# Patient Record
Sex: Female | Born: 1990 | Race: Black or African American | Hispanic: No | Marital: Single | Smoking: Never smoker
Health system: Southern US, Community
[De-identification: ages and names within clinical notes are randomized; demographics above are authoritative.]

## PROBLEM LIST (undated history)

## (undated) DIAGNOSIS — E669 Obesity, unspecified: Secondary | ICD-10-CM

## (undated) DIAGNOSIS — I10 Essential (primary) hypertension: Secondary | ICD-10-CM

---
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
## 2011-03-27 ENCOUNTER — Emergency Department (HOSPITAL_BASED_OUTPATIENT_CLINIC_OR_DEPARTMENT_OTHER)
Admission: EM | Admit: 2011-03-27 | Discharge: 2011-03-27 | Disposition: A | Discharge status: Home / Self Care | Payer: Self-pay | Attending: Emergency Medicine | Admitting: Emergency Medicine

## 2011-03-27 DIAGNOSIS — R05 Cough: Secondary | ICD-10-CM | POA: Insufficient documentation

## 2011-03-27 DIAGNOSIS — R059 Cough, unspecified: Secondary | ICD-10-CM | POA: Insufficient documentation

## 2011-06-28 ENCOUNTER — Emergency Department (INDEPENDENT_AMBULATORY_CARE_PROVIDER_SITE_OTHER): Payer: Self-pay

## 2011-06-28 ENCOUNTER — Other Ambulatory Visit: Payer: Self-pay

## 2011-06-28 ENCOUNTER — Encounter: Payer: Self-pay | Admitting: Family Medicine

## 2011-06-28 ENCOUNTER — Emergency Department (HOSPITAL_BASED_OUTPATIENT_CLINIC_OR_DEPARTMENT_OTHER)
Admission: EM | Admit: 2011-06-28 | Discharge: 2011-06-28 | Disposition: A | Discharge status: Home / Self Care | Payer: Self-pay | Attending: Emergency Medicine | Admitting: Emergency Medicine

## 2011-06-28 DIAGNOSIS — R1013 Epigastric pain: Secondary | ICD-10-CM

## 2011-06-28 DIAGNOSIS — R079 Chest pain, unspecified: Secondary | ICD-10-CM | POA: Insufficient documentation

## 2011-06-28 DIAGNOSIS — R110 Nausea: Secondary | ICD-10-CM | POA: Insufficient documentation

## 2011-06-28 DIAGNOSIS — R0602 Shortness of breath: Secondary | ICD-10-CM

## 2011-06-28 DIAGNOSIS — R1012 Left upper quadrant pain: Secondary | ICD-10-CM | POA: Insufficient documentation

## 2011-06-28 LAB — DIFFERENTIAL
Eosinophils Absolute: 0.1 10*3/uL (ref 0.0–0.7)
Lymphocytes Relative: 39 % (ref 12–46)
Lymphs Abs: 2.3 10*3/uL (ref 0.7–4.0)
Monocytes Relative: 11 % (ref 3–12)
Neutrophils Relative %: 48 % (ref 43–77)

## 2011-06-28 LAB — BASIC METABOLIC PANEL
BUN: 6 mg/dL (ref 6–23)
CO2: 27 mEq/L (ref 19–32)
Chloride: 105 mEq/L (ref 96–112)
GFR calc non Af Amer: >90 mL/min (ref >90)
Glucose, Bld: 81 mg/dL (ref 70–99)
Potassium: 4.4 mEq/L (ref 3.5–5.1)
Sodium: 138 mEq/L (ref 135–145)

## 2011-06-28 LAB — PREGNANCY, URINE: Preg Test, Ur: NEGATIVE

## 2011-06-28 LAB — CBC
Hemoglobin: 11.7 g/dL — ABNORMAL LOW (ref 12.0–15.0)
MCH: 30.4 pg (ref 26.0–34.0)
RBC: 3.85 MIL/uL — ABNORMAL LOW (ref 3.87–5.11)
WBC: 6.1 10*3/uL (ref 4.0–10.5)

## 2011-06-28 NOTE — ED Notes (Signed)
Patient assisted to bathroom; Urine sample obtained.

## 2011-06-28 NOTE — ED Notes (Signed)
Pt c/o pain under left chest that started after eating approx 20 mins ago. Pt reports similar pain in past that was relieved by prevacid. Pt denies cardiac history. Pt rates pain 8/10 and worse with movement and deep inspiration.

## 2011-06-28 NOTE — ED Provider Notes (Signed)
History     CSN: [id number] Arrival date & time: 06/28/2011  6:59 PM  Chief Complaint  Patient presents with  . Chest Pain    (Consider location/radiation/quality/duration/timing/severity/associated sxs/prior treatment) HPI Comments: The patient is a 20 year old female who presents for evaluation of chest pain. The pain is localized at the left lower chest or rather the left upper abdomen. She reports the pain is sharp and its onset was immediately after eating bacon tonight. She reports the pain is gone now. There is no radiation of pain and the patient denies any shortness of breath, nausea, or vomiting. The pain was sharp and lasted approximately 15-20 minutes and has completely resolved now. She has no prior medical problems.  Patient is a 20 y.o. female presenting with chest pain. The history is provided by the patient.  Chest Pain The chest pain began 1 - 2 hours ago. Duration of episode(s) is 15 minutes. Chest pain occurs constantly. The chest pain is resolved. The pain is associated with eating. At its most intense, the pain is at 7/10. The pain is currently at 0/10. The quality of the pain is described as sharp. The pain does not radiate. Exacerbated by: nothing. Primary symptoms include abdominal pain and nausea. Pertinent negatives for primary symptoms include no fever, no fatigue, no syncope, no shortness of breath, no cough, no wheezing, no palpitations, no vomiting, no dizziness and no altered mental status.  The abdominal pain began today. The abdominal pain has been resolved since its onset. The abdominal pain is located in the LUQ. The abdominal pain does not radiate. The severity of the abdominal pain is 7/10. The abdominal pain is relieved by nothing.  Pertinent negatives for associated symptoms include no claudication, no diaphoresis, no lower extremity edema, no near-syncope, no numbness, no orthopnea, no paroxysmal nocturnal dyspnea and no weakness. She tried nothing for the  symptoms. Risk factors include no known risk factors.  Pertinent negatives for past medical history include no arrhythmia, no CAD, no congenital heart disease, no COPD, no CHF, no diabetes, no hyperlipidemia, no hypertension and no MI.  Pertinent negatives for family medical history include: no early MI in family.     History reviewed. No pertinent past medical history.  History reviewed. No pertinent past surgical history.  No family history on file.  History  Substance Use Topics  . Smoking status: Never Smoker   . Smokeless tobacco: Not on file  . Alcohol Use: No    OB History    Grav Para Term Preterm Abortions TAB SAB Ect Mult Living                  Review of Systems  Constitutional: Negative for fever, diaphoresis and fatigue.  HENT: Negative.   Eyes: Negative.   Respiratory: Negative for cough, choking, shortness of breath and wheezing.   Cardiovascular: Positive for chest pain. Negative for palpitations, orthopnea, claudication, leg swelling, syncope and near-syncope.  Gastrointestinal: Positive for nausea and abdominal pain. Negative for vomiting.  Genitourinary: Negative.   Musculoskeletal: Negative.   Skin: Negative.   Neurological: Negative for dizziness, weakness and numbness.  Psychiatric/Behavioral: Negative.  Negative for altered mental status.    Allergies  Review of patient's allergies indicates no known allergies.  Home Medications  No current outpatient prescriptions on file.  BP 121/78  Pulse 85  Temp(Src) 98.1 F (36.7 C) (Oral)  Resp 16  Ht 5\' 3"  (1.6 m)  Wt 221 lb 7 oz (100.443 kg)  BMI 39.23 kg/m2  SpO2 100%  LMP 05/29/2011  Physical Exam  Nursing note and vitals reviewed. Constitutional: She is oriented to person, place, and time. She appears well-nourished. No distress.  HENT:  Head: Normocephalic and atraumatic.  Mouth/Throat: Oropharynx is clear and moist.  Eyes: EOM are normal. Pupils are equal, round, and reactive to light.   Neck: Normal range of motion. Neck supple. No JVD present. No tracheal deviation present.  Cardiovascular: Normal rate, regular rhythm, S1 normal, S2 normal, normal heart sounds and intact distal pulses.   No extrasystoles are present. PMI is not displaced.  Exam reveals no gallop and no friction rub.   No murmur heard. Pulmonary/Chest: Effort normal and breath sounds normal. No accessory muscle usage or stridor. Not tachypneic. No respiratory distress. She has no decreased breath sounds. She has no wheezes. She has no rhonchi. She has no rales. She exhibits no tenderness, no bony tenderness, no crepitus and no retraction.  Abdominal: Soft. Bowel sounds are normal. She exhibits no distension and no mass. There is no tenderness. There is no rigidity, no rebound and no guarding.  Musculoskeletal: Normal range of motion. She exhibits no edema and no tenderness.  Neurological: She is alert and oriented to person, place, and time. No cranial nerve deficit. She exhibits normal muscle tone.  Skin: Skin is warm and dry. No rash noted. She is not diaphoretic. No erythema. No pallor.  Psychiatric: She has a normal mood and affect. Her behavior is normal. Judgment and thought content normal.    ED Course  Procedures (including critical care time)  Date: 06/28/2011  Rate: 83  Rhythm: normal sinus rhythm  QRS Axis: normal  Intervals: normal  ST/T Wave abnormalities: normal  Conduction Disutrbances:none  Narrative Interpretation: Normal EKG  Old EKG Reviewed: none available   Labs Reviewed  CBC - Abnormal; Notable for the following:    RBC 3.85 (*)    Hemoglobin 11.7 (*)    HCT 34.8 (*)    All other components within normal limits  PREGNANCY, URINE  TROPONIN I  DIFFERENTIAL  BASIC METABOLIC PANEL  URINALYSIS, ROUTINE W REFLEX MICROSCOPIC   No results found.   No diagnosis found.    MDM  The patient's chest pain appears most likely the gastrointestinal chest pain. Her EKG is normal and  at this time her laboratory studies and chest x-ray are also normal. The patient has remained chest pain-free at this time.        Felisa Bonier, MD 06/29/11 2135

## 2011-06-28 NOTE — ED Notes (Signed)
MD at bedside.Pt reports Hx of chest and epigastric discomfort

## 2011-08-26 DIAGNOSIS — R079 Chest pain, unspecified: Secondary | ICD-10-CM | POA: Insufficient documentation

## 2011-08-26 DIAGNOSIS — R071 Chest pain on breathing: Secondary | ICD-10-CM | POA: Insufficient documentation

## 2011-08-27 ENCOUNTER — Encounter (HOSPITAL_BASED_OUTPATIENT_CLINIC_OR_DEPARTMENT_OTHER): Payer: Self-pay | Admitting: *Deleted

## 2011-08-27 ENCOUNTER — Emergency Department (INDEPENDENT_AMBULATORY_CARE_PROVIDER_SITE_OTHER): Payer: Self-pay

## 2011-08-27 ENCOUNTER — Emergency Department (HOSPITAL_BASED_OUTPATIENT_CLINIC_OR_DEPARTMENT_OTHER)
Admission: EM | Admit: 2011-08-27 | Discharge: 2011-08-27 | Disposition: A | Discharge status: Home / Self Care | Payer: Self-pay | Attending: Emergency Medicine | Admitting: Emergency Medicine

## 2011-08-27 DIAGNOSIS — R079 Chest pain, unspecified: Secondary | ICD-10-CM

## 2011-08-27 DIAGNOSIS — R0789 Other chest pain: Secondary | ICD-10-CM

## 2011-08-27 NOTE — ED Provider Notes (Addendum)
History     CSN: [id number] Arrival date & time: 08/27/2011 12:11 AM   None     Chief Complaint  Patient presents with  . Muscle Pain   history of present illness :complains of right lateral chest pain onset 2-3 days ago after exercising pain is worse with twisting her torso proved with remaining still nonradiating pleuritic in quality mild in severity treated with ibuprofen with partial relief and adequate pain relief  (Consider location/radiation/quality/duration/timing/severity/associated sxs/prior treatment) HPI  History reviewed. No pertinent past medical history. None History reviewed. No pertinent past surgical history. None History reviewed. No pertinent family history.  History  Substance Use Topics  . Smoking status: Never Smoker   . Smokeless tobacco: Not on file  . Alcohol Use: No   Nonsmoker no alcohol no drug use OB History    Grav Para Term Preterm Abortions TAB SAB Ect Mult Living                  Review of Systems  Constitutional: Negative.   HENT: Negative.   Respiratory: Negative.   Cardiovascular: Positive for chest pain.  Gastrointestinal: Negative.   Musculoskeletal: Negative.   Skin: Negative.   Neurological: Negative.   Hematological: Negative.   Psychiatric/Behavioral: Negative.   All other systems reviewed and are negative.    Allergies  Review of patient's allergies indicates no known allergies.  Home Medications  No current outpatient prescriptions on file.  BP 134/73  Pulse 78  Temp(Src) 98.6 F (37 C) (Oral)  Resp 18  Ht 5\' 5"  (1.651 m)  Wt 230 lb (104.327 kg)  BMI 38.27 kg/m2  SpO2 100%  LMP 07/30/2011  Physical Exam  Nursing note and vitals reviewed. Constitutional: She appears well-developed and well-nourished.  HENT:  Head: Normocephalic and atraumatic.  Eyes: Conjunctivae are normal. Pupils are equal, round, and reactive to light.  Neck: Neck supple. No tracheal deviation present. No thyromegaly present.    Cardiovascular: Normal rate and regular rhythm.   No murmur heard. Pulmonary/Chest: Effort normal and breath sounds normal.       Right lateral chest wall mildly tender no crepitance  Abdominal: Soft. Bowel sounds are normal. She exhibits no distension. There is no tenderness.  Musculoskeletal: Normal range of motion. She exhibits no edema and no tenderness.  Neurological: She is alert. Coordination normal.  Skin: Skin is warm and dry. No rash noted.  Psychiatric: She has a normal mood and affect.    ED Course  Procedures (including critical care time)  Labs Reviewed - No data to display Dg Ribs Unilateral W/chest Right  08/27/2011  *RADIOLOGY REPORT*  Clinical Data: Right lower chest aches and rib pain.  RIGHT RIBS AND CHEST - 3+ VIEW  Comparison: Chest radiograph performed 06/28/2011  Findings: No displaced rib fractures are seen.  The lungs are well-aerated and clear.  There is no evidence of focal opacification, pleural effusion or pneumothorax.  The cardiomediastinal silhouette is within normal limits.  No acute osseous abnormalities are seen.  IMPRESSION: No displaced rib fractures seen; no acute cardiopulmonary process identified.  Original Report Authenticated By: Tonia Ghent, M.D.     No diagnosis found.   Date: 08/27/2011  Rate: 95  Rhythm: normal sinus rhythm  QRS Axis: normal  Intervals: normal  ST/T Wave abnormalities: nonspecific ST/T changes  Conduction Disutrbances:none  Narrative Interpretation:   Old EKG Reviewed: unchanged  Unchanged from 06/28/2011 MDM  Plan continue ibuprofen as needed. Patient declines prescription for other pain medicine Diagnosis chest  wall pain        Doug Sou, MD 08/27/11 1610  Doug Sou, MD 08/27/11 [id number]  Doug Sou, MD 08/27/11 [phone]

## 2011-08-27 NOTE — ED Notes (Signed)
Pt reports right side chest pain since working out- states "feels like my bones"- sx x 3 days

## 2011-11-15 ENCOUNTER — Emergency Department (HOSPITAL_BASED_OUTPATIENT_CLINIC_OR_DEPARTMENT_OTHER)
Admission: EM | Admit: 2011-11-15 | Discharge: 2011-11-15 | Disposition: A | Discharge status: Home / Self Care | Payer: Self-pay | Attending: Emergency Medicine | Admitting: Emergency Medicine

## 2011-11-15 ENCOUNTER — Encounter (HOSPITAL_BASED_OUTPATIENT_CLINIC_OR_DEPARTMENT_OTHER): Payer: Self-pay

## 2011-11-15 DIAGNOSIS — L723 Sebaceous cyst: Secondary | ICD-10-CM | POA: Insufficient documentation

## 2011-11-15 DIAGNOSIS — L03319 Cellulitis of trunk, unspecified: Secondary | ICD-10-CM | POA: Insufficient documentation

## 2011-11-15 DIAGNOSIS — L02219 Cutaneous abscess of trunk, unspecified: Secondary | ICD-10-CM | POA: Insufficient documentation

## 2011-11-15 DIAGNOSIS — L089 Local infection of the skin and subcutaneous tissue, unspecified: Secondary | ICD-10-CM

## 2011-11-15 MED ORDER — LIDOCAINE HCL 2 % IJ SOLN
10.0000 mL | Freq: Once | INTRAMUSCULAR | Status: AC
  Administered 2011-11-15: 20 mg via INTRADERMAL
  Filled 2011-11-15: qty 1

## 2011-11-15 NOTE — ED Provider Notes (Addendum)
History     CSN: [id number]  Arrival date & time 11/15/11  1422   First MD Initiated Contact with Patient 11/15/11 1444      Chief Complaint  Patient presents with  . Cyst    (Consider location/radiation/quality/duration/timing/severity/associated sxs/prior treatment) Patient is a 21 y.o. female presenting with abscess. The history is provided by the patient.  Abscess  This is a new problem. Episode onset: 3 days ago. The onset was gradual. The problem occurs continuously. The problem has been gradually worsening. Affected Location: Left chest. The problem is mild. The abscess is characterized by redness and swelling. It is unknown what she was exposed to. The abscess first occurred at home. Pertinent negatives include no anorexia, no fever, no rhinorrhea and no sore throat. Her past medical history does not include skin abscesses in family. There were no sick contacts. She has received no recent medical care.    History reviewed. No pertinent past medical history.  History reviewed. No pertinent past surgical history.  History reviewed. No pertinent family history.  History  Substance Use Topics  . Smoking status: Never Smoker   . Smokeless tobacco: Not on file  . Alcohol Use: No    OB History    Grav Para Term Preterm Abortions TAB SAB Ect Mult Living                  Review of Systems  Constitutional: Negative for fever.  HENT: Negative for sore throat and rhinorrhea.   Gastrointestinal: Negative for anorexia.  All other systems reviewed and are negative.    Allergies  Review of patient's allergies indicates no known allergies.  Home Medications  No current outpatient prescriptions on file.  BP 156/81  Pulse 96  Temp(Src) 98.2 F (36.8 C) (Oral)  Resp 18  Ht 5\' 3"  (1.6 m)  Wt 224 lb (101.606 kg)  BMI 39.68 kg/m2  SpO2 100%  LMP 10/30/2011  Physical Exam  Nursing note and vitals reviewed. Constitutional: She is oriented to person, place, and time.  She appears well-developed and well-nourished. No distress.  HENT:  Head: Normocephalic and atraumatic.  Eyes: EOM are normal. Pupils are equal, round, and reactive to light.  Pulmonary/Chest: She exhibits tenderness.    Neurological: She is alert and oriented to person, place, and time.  Skin: Skin is warm and dry. No rash noted. No erythema.    ED Course  Procedures (including critical care time)  Labs Reviewed - No data to display No results found.  INCISION AND DRAINAGE Performed by: Gwyneth Sprout Consent: Verbal consent obtained. Risks and benefits: risks, benefits and alternatives were discussed Type: abscess  Body area: left chest wall  Anesthesia: local infiltration  Local anesthetic: lidocaine 1% without epinephrine  Anesthetic total: 1 ml  Complexity:simple Drainage: purulent  Drainage amount: scant   Patient tolerance: Patient tolerated the procedure well with no immediate complications.    1. Infected sebaceous cyst       MDM   Patient with small infected sebaceous cyst on her chest. No other complicating features. Area was opened and cyst was expelled.        Gwyneth Sprout, MD 11/15/11 1515  Gwyneth Sprout, MD 11/15/11 1517

## 2011-11-15 NOTE — ED Notes (Signed)
C/o "bump on my chest"-painful to touch- x 1 week

## 2011-11-15 NOTE — Discharge Instructions (Signed)
Epidermal Cyst An epidermal cyst is sometimes called a sebaceous cyst, epidermal inclusion cyst, or infundibular cyst. These cysts usually contain a substance that looks "pasty" or "cheesy" and may have a bad smell. This substance is a protein called keratin. Epidermal cysts are usually found on the face, neck, or trunk. They may also occur in the vaginal area or other parts of the genitalia of both men and women. Epidermal cysts are usually small, painless, slow-growing bumps or lumps that move freely under the skin. It is important not to try to pop them. This may cause an infection and lead to tenderness and swelling. CAUSES  Epidermal cysts may be caused by a deep penetrating injury to the skin or a plugged hair follicle, often associated with acne. SYMPTOMS  Epidermal cysts can become inflamed and cause:  Redness.   Tenderness.   Increased temperature of the skin over the bumps or lumps.   Grayish-white, bad smelling material that drains from the bump or lump.  DIAGNOSIS  Epidermal cysts are easily diagnosed by your caregiver during an exam. Rarely, a tissue sample (biopsy) may be taken to rule out other conditions that may resemble epidermal cysts. TREATMENT   Epidermal cysts often get better and disappear on their own. They are rarely ever cancerous.   If a cyst becomes infected, it may become inflamed and tender. This may require opening and draining the cyst. Treatment with antibiotics may be necessary. When the infection is gone, the cyst may be removed with minor surgery.   Small, inflamed cysts can often be treated with antibiotics or by injecting steroid medicines.   Sometimes, epidermal cysts become large and bothersome. If this happens, surgical removal in your caregiver's office may be necessary.  HOME CARE INSTRUCTIONS  Only take over-the-counter or prescription medicines as directed by your caregiver.    warm compress 2-3 times a day or hot shower  SEEK MEDICAL CARE  IF:   Your cyst becomes tender, red, or swollen.   Your condition is not improving or is getting worse.   You have any other questions or concerns.  MAKE SURE YOU:  Understand these instructions.   Will watch your condition.   Will get help right away if you are not doing well or get worse.  Document Released: 08/11/2004 Document Revised: 05/23/2011 Document Reviewed: 03/19/2011 [facility] Patient Information 2012 [place], [place].

## 2012-05-08 ENCOUNTER — Encounter (HOSPITAL_BASED_OUTPATIENT_CLINIC_OR_DEPARTMENT_OTHER): Payer: Self-pay | Admitting: *Deleted

## 2012-05-08 ENCOUNTER — Emergency Department (HOSPITAL_BASED_OUTPATIENT_CLINIC_OR_DEPARTMENT_OTHER)
Admission: EM | Admit: 2012-05-08 | Discharge: 2012-05-08 | Disposition: A | Discharge status: Home / Self Care | Payer: Self-pay | Attending: Emergency Medicine | Admitting: Emergency Medicine

## 2012-05-08 DIAGNOSIS — F458 Other somatoform disorders: Secondary | ICD-10-CM | POA: Insufficient documentation

## 2012-05-08 DIAGNOSIS — I10 Essential (primary) hypertension: Secondary | ICD-10-CM | POA: Insufficient documentation

## 2012-05-08 DIAGNOSIS — R0609 Other forms of dyspnea: Secondary | ICD-10-CM | POA: Insufficient documentation

## 2012-05-08 DIAGNOSIS — R0989 Other specified symptoms and signs involving the circulatory and respiratory systems: Secondary | ICD-10-CM | POA: Insufficient documentation

## 2012-05-08 DIAGNOSIS — G4763 Sleep related bruxism: Secondary | ICD-10-CM

## 2012-05-08 DIAGNOSIS — R0683 Snoring: Secondary | ICD-10-CM

## 2012-05-08 MED ORDER — HYDROCHLOROTHIAZIDE 25 MG PO TABS: 25.0000 mg | ORAL_TABLET | Freq: Every day | ORAL | Status: DC

## 2012-05-08 NOTE — ED Provider Notes (Signed)
History     CSN: [id number]  Arrival date & time 05/08/12  1906   First MD Initiated Contact with Patient 05/08/12 1923      Chief Complaint  Patient presents with  . Headache     HPI Patient complains of headache mostly noted in the morning she wakes up.  The going on for several weeks.  Patient denies any nausea, vomiting, photophobia, fever, chills, or neck stiffness.  No history of recent trauma.  Patient does grind her teeth at night and she also has severe snoring problems. History reviewed. No pertinent past medical history.  History reviewed. No pertinent past surgical history.  No family history on file.  History  Substance Use Topics  . Smoking status: Never Smoker   . Smokeless tobacco: Not on file  . Alcohol Use: No    OB History    Grav Para Term Preterm Abortions TAB SAB Ect Mult Living                  Review of Systems  All other systems reviewed and are negative.    Allergies  Review of patient's allergies indicates no known allergies.  Home Medications   Current Outpatient Rx  Name Route Sig Dispense Refill  . HYDROCHLOROTHIAZIDE 25 MG PO TABS Oral Take 1 tablet (25 mg total) by mouth daily. 30 tablet 0    BP 138/88  Pulse 102  Temp 98.1 F (36.7 C) (Oral)  Resp 20  SpO2 100%  Physical Exam  Nursing note and vitals reviewed. Constitutional: She is oriented to person, place, and time. She appears well-developed. No distress.  HENT:  Head: Normocephalic and atraumatic.  Eyes: Conjunctivae and EOM are normal. Pupils are equal, round, and reactive to light.  Fundoscopic exam:      The right eye shows no hemorrhage and no papilledema. The right eye shows red reflex.      The left eye shows no hemorrhage and no papilledema. The left eye shows red reflex. Neck: Normal range of motion.  Cardiovascular: Normal rate and intact distal pulses.   Pulmonary/Chest: No respiratory distress.  Abdominal: Normal appearance. She exhibits no  distension.  Musculoskeletal: Normal range of motion.  Neurological: She is alert and oriented to person, place, and time. No cranial nerve deficit.  Skin: Skin is warm and dry. No rash noted.  Psychiatric: She has a normal mood and affect. Her behavior is normal.    ED Course  Procedures (including critical care time)  Labs Reviewed - No data to display No results found.   1. Hypertension   2. Snoring disorder   3. Bruxism, sleep-related       MDM          Nelia Shi, MD 05/08/12 2029

## 2012-05-08 NOTE — ED Notes (Signed)
Headache x 1 week

## 2012-05-08 NOTE — ED Notes (Signed)
Pt reports having htn with and without headaches, this has been a reoccuring problem that has been going on for a while. Long discussion with patient regarding the need for primary care physician to monitor bp and life style changes. Pt is currently looking for pcp

## 2012-05-29 ENCOUNTER — Encounter (HOSPITAL_BASED_OUTPATIENT_CLINIC_OR_DEPARTMENT_OTHER): Payer: Self-pay | Admitting: *Deleted

## 2012-05-29 ENCOUNTER — Emergency Department (HOSPITAL_BASED_OUTPATIENT_CLINIC_OR_DEPARTMENT_OTHER)
Admission: EM | Admit: 2012-05-29 | Discharge: 2012-05-29 | Disposition: A | Discharge status: Home / Self Care | Payer: Self-pay | Attending: Emergency Medicine | Admitting: Emergency Medicine

## 2012-05-29 DIAGNOSIS — J039 Acute tonsillitis, unspecified: Secondary | ICD-10-CM | POA: Insufficient documentation

## 2012-05-29 MED ORDER — PENICILLIN V POTASSIUM 500 MG PO TABS: 500.0000 mg | ORAL_TABLET | Freq: Four times a day (QID) | ORAL | Status: AC

## 2012-05-29 NOTE — ED Provider Notes (Signed)
History     CSN: [id number]  Arrival date & time 05/29/12  1604   First MD Initiated Contact with Patient 05/29/12 1613      Chief Complaint  Patient presents with  . Sore Throat  . Headache  . Dental Pain    (Consider location/radiation/quality/duration/timing/severity/associated sxs/prior treatment) Patient is a 21 y.o. female presenting with pharyngitis. The history is provided by the patient. No language interpreter was used.  Sore Throat This is a new problem. The current episode started yesterday. The problem has been gradually worsening. Associated symptoms include a fever and a sore throat. Nothing aggravates the symptoms. She has tried nothing for the symptoms. The treatment provided no relief.   Pt reports she has a sorethroat and a fever.  Pt reports white spots on tonsils History reviewed. No pertinent past medical history.  History reviewed. No pertinent past surgical history.  History reviewed. No pertinent family history.  History  Substance Use Topics  . Smoking status: Never Smoker   . Smokeless tobacco: Not on file  . Alcohol Use: No    OB History    Grav Para Term Preterm Abortions TAB SAB Ect Mult Living                  Review of Systems  Constitutional: Positive for fever.  HENT: Positive for sore throat.   All other systems reviewed and are negative.    Allergies  Review of patient's allergies indicates no known allergies.  Home Medications   Current Outpatient Rx  Name Route Sig Dispense Refill  . HYDROCHLOROTHIAZIDE 25 MG PO TABS Oral Take 1 tablet (25 mg total) by mouth daily. 30 tablet 0    BP 146/83  Pulse 112  Temp 99.1 F (37.3 C) (Oral)  Resp 20  SpO2 100%  LMP 04/20/2012  Physical Exam  Nursing note and vitals reviewed. Constitutional: She appears well-developed and well-nourished.  HENT:  Head: Normocephalic and atraumatic.  Mouth/Throat: Oropharyngeal exudate present.       Swollen tonsils,  exudate  Eyes:  Pupils are equal, round, and reactive to light.  Neck: Normal range of motion. Neck supple.  Cardiovascular: Normal rate and normal heart sounds.   Pulmonary/Chest: Effort normal and breath sounds normal.  Abdominal: Soft. There is no tenderness.  Musculoskeletal: Normal range of motion.  Neurological: She is alert.  Skin: Skin is warm.    ED Course  Procedures (including critical care time)   Labs Reviewed  RAPID STREP SCREEN   No results found.   1. Tonsillitis       MDM  Pt reports she also has had a headache bodyaches and breast discomfort bilat,   No pregnant, no abd pain.    I advised tylenol,   I will treat with pcn.   I advised follow up with primary Md for recheck.        Lonia Skinner Bell, PA 05/29/12 1721  Lonia Skinner [place], [place] 06/02/12 1815

## 2012-05-29 NOTE — ED Notes (Signed)
Pt states she has an abcessed tooth a very sore throat with white patches on throat states very painful to swallow. Also has headache and nipple pain.

## 2012-06-04 NOTE — ED Provider Notes (Signed)
Medical screening examination/treatment/procedure(s) were performed by non-physician practitioner and as supervising physician I was immediately available for consultation/collaboration.  Cyndra Numbers, MD 06/04/12 1010

## 2012-06-12 ENCOUNTER — Emergency Department (HOSPITAL_BASED_OUTPATIENT_CLINIC_OR_DEPARTMENT_OTHER)
Admission: EM | Admit: 2012-06-12 | Discharge: 2012-06-12 | Disposition: A | Discharge status: Home / Self Care | Payer: Self-pay | Attending: Emergency Medicine | Admitting: Emergency Medicine

## 2012-06-12 ENCOUNTER — Encounter (HOSPITAL_BASED_OUTPATIENT_CLINIC_OR_DEPARTMENT_OTHER): Payer: Self-pay | Admitting: *Deleted

## 2012-06-12 DIAGNOSIS — I10 Essential (primary) hypertension: Secondary | ICD-10-CM

## 2012-06-12 DIAGNOSIS — Z79899 Other long term (current) drug therapy: Secondary | ICD-10-CM | POA: Insufficient documentation

## 2012-06-12 DIAGNOSIS — R51 Headache: Secondary | ICD-10-CM

## 2012-06-12 HISTORY — DX: Essential (primary) hypertension: I10

## 2012-06-12 MED ORDER — HYDROCHLOROTHIAZIDE 25 MG PO TABS
25.0000 mg | ORAL_TABLET | Freq: Once | ORAL | Status: AC
  Administered 2012-06-12: 25 mg via ORAL
  Filled 2012-06-12: qty 1

## 2012-06-12 MED ORDER — IBUPROFEN 400 MG PO TABS
400.0000 mg | ORAL_TABLET | Freq: Once | ORAL | Status: AC
  Administered 2012-06-12: 400 mg via ORAL
  Filled 2012-06-12: qty 1

## 2012-06-12 MED ORDER — HYDROCHLOROTHIAZIDE 25 MG PO TABS: 25.0000 mg | ORAL_TABLET | Freq: Every day | ORAL | Status: DC

## 2012-06-12 NOTE — ED Provider Notes (Signed)
History     CSN: [id number]  Arrival date & time 06/12/12  Jill Clark   First MD Initiated Contact with Patient 06/12/12 1953      Chief Complaint  Patient presents with  . Headache    (Consider location/radiation/quality/duration/timing/severity/associated sxs/prior treatment) HPI Comments: Pt reports she ran out of BP meds about 1 week ago and since then has had a mild waxing and waning HA.  She recently got over tonsillitis.  She denies fever, stiff neck, focal numbness or weakness or other stroke like symptoms.  No rash.  She thinks that if she simply was back on BP meds, her HA would be better.  Pt has had similar HA's in the past.  Denies abrupt HA.  No N/V/D.  No photophobia, loss of balance, syncope.    Patient is a 21 y.o. female presenting with headaches. The history is provided by the patient.  Headache  Pertinent negatives include no fever, no nausea and no vomiting.    Past Medical History  Diagnosis Date  . Hypertension     History reviewed. No pertinent past surgical history.  History reviewed. No pertinent family history.  History  Substance Use Topics  . Smoking status: Never Smoker   . Smokeless tobacco: Not on file  . Alcohol Use: No    OB History    Grav Para Term Preterm Abortions TAB SAB Ect Mult Living                  Review of Systems  Constitutional: Negative for fever and chills.  HENT: Negative for neck pain and neck stiffness.   Eyes: Negative for photophobia and visual disturbance.  Gastrointestinal: Negative for nausea and vomiting.  Skin: Negative for rash.  Neurological: Positive for headaches. Negative for dizziness, seizures, syncope, weakness, light-headedness and numbness.    Allergies  Review of patient's allergies indicates no known allergies.  Home Medications   Current Outpatient Rx  Name Route Sig Dispense Refill  . HYDROCHLOROTHIAZIDE 25 MG PO TABS Oral Take 1 tablet (25 mg total) by mouth daily. 30 tablet 0  .  HYDROCHLOROTHIAZIDE 25 MG PO TABS Oral Take 1 tablet (25 mg total) by mouth daily. 30 tablet 1    BP 145/85  Pulse 85  Temp 98 F (36.7 C) (Oral)  Resp 16  Ht 5\' 4"  (1.626 m)  Wt 240 lb (108.863 kg)  BMI 41.20 kg/m2  SpO2 100%  LMP 04/20/2012  Physical Exam  Nursing note and vitals reviewed. Constitutional: She is oriented to person, place, and time. She appears well-developed and well-nourished.  HENT:  Head: Normocephalic and atraumatic.  Eyes: Pupils are equal, round, and reactive to light. No scleral icterus.  Neck: Normal range of motion. Neck supple.  Pulmonary/Chest: Effort normal.  Abdominal: Soft. There is no rebound and no guarding.  Neurological: She is alert and oriented to person, place, and time. No cranial nerve deficit. She exhibits normal muscle tone. Coordination and gait normal.  Skin: Skin is warm and dry.  Psychiatric: She has a normal mood and affect.    ED Course  Procedures (including critical care time)  Labs Reviewed - No data to display No results found.   1. Headache   2. Hypertension       MDM  Non focal neuro exam, BP is mildly elevated, no evidence or signs of end organ failure, will d/c home        Gavin Pound. Oletta Lamas, MD 06/12/12 2010

## 2012-06-12 NOTE — Discharge Instructions (Signed)
 Arterial Hypertension Arterial hypertension (high blood pressure) is a condition of elevated pressure in your blood vessels. Hypertension over a long period of time is a risk factor for strokes, heart attacks, and heart failure. It is also the leading cause of kidney (renal) failure.  CAUSES   In Adults -- Over 90% of all hypertension has no known cause. This is called essential or primary hypertension. In the other 10% of people with hypertension, the increase in blood pressure is caused by another disorder. This is called secondary hypertension. Important causes of secondary hypertension are:   Heavy alcohol use.   Obstructive sleep apnea.   Hyperaldosterosim (Conn's syndrome).   Steroid use.   Chronic kidney failure.   Hyperparathyroidism.   Medications.   Renal artery stenosis.   Pheochromocytoma.   Cushing's disease.   Coarctation of the aorta.   Scleroderma renal crisis.   Licorice (in excessive amounts).   Drugs (cocaine, methamphetamine).  Your caregiver can explain any items above that apply to you.  In Children -- Secondary hypertension is more common and should always be considered.   Pregnancy -- Few women of childbearing age have high blood pressure. However, up to 10% of them develop hypertension of pregnancy. Generally, this will not harm the woman. It may be a sign of 3 complications of pregnancy: preeclampsia, HELLP syndrome, and eclampsia. Follow up and control with medication is necessary.  SYMPTOMS   This condition normally does not produce any noticeable symptoms. It is usually found during a routine exam.   Malignant hypertension is a late problem of high blood pressure. It may have the following symptoms:   Headaches.   Blurred vision.   End-organ damage (this means your kidneys, heart, lungs, and other organs are being damaged).   Stressful situations can increase the blood pressure. If a person with normal blood pressure has their blood  pressure go up while being seen by their caregiver, this is often termed "white coat hypertension." Its importance is not known. It may be related with eventually developing hypertension or complications of hypertension.   Hypertension is often confused with mental tension, stress, and anxiety.  DIAGNOSIS  The diagnosis is made by 3 separate blood pressure measurements. They are taken at least 1 week apart from each other. If there is organ damage from hypertension, the diagnosis may be made without repeat measurements. Hypertension is usually identified by having blood pressure readings:  Above 140/90 mmHg measured in both arms, at 3 separate times, over a couple weeks.   Over 130/80 mmHg should be considered a risk factor and may require treatment in patients with diabetes.  Blood pressure readings over 120/80 mmHg are called "pre-hypertension" even in non-diabetic patients. To get a true blood pressure measurement, use the following guidelines. Be aware of the factors that can alter blood pressure readings.  Take measurements at least 1 hour after caffeine.   Take measurements 30 minutes after smoking and without any stress. This is another reason to quit smoking - it raises your blood pressure.   Use a proper cuff size. Ask your caregiver if you are not sure about your cuff size.   Most home blood pressure cuffs are automatic. They will measure systolic and diastolic pressures. The systolic pressure is the pressure reading at the start of sounds. Diastolic pressure is the pressure at which the sounds disappear. If you are elderly, measure pressures in multiple postures. Try sitting, lying or standing.   Sit at rest for a minimum of  5 minutes before taking measurements.   You should not be on any medications like decongestants. These are found in many cold medications.   Record your blood pressure readings and review them with your caregiver.  If you have hypertension:  Your caregiver  may do tests to be sure you do not have secondary hypertension (see "causes" above).   Your caregiver may also look for signs of metabolic syndrome. This is also called Syndrome X or Insulin Resistance Syndrome. You may have this syndrome if you have type 2 diabetes, abdominal obesity, and abnormal blood lipids in addition to hypertension.   Your caregiver will take your medical and family history and perform a physical exam.   Diagnostic tests may include blood tests (for glucose, cholesterol, potassium, and kidney function), a urinalysis, or an EKG. Other tests may also be necessary depending on your condition.  PREVENTION  There are important lifestyle issues that you can adopt to reduce your chance of developing hypertension:  Maintain a normal weight.   Limit the amount of salt (sodium) in your diet.   Exercise often.   Limit alcohol intake.   Get enough potassium in your diet. Discuss specific advice with your caregiver.   Follow a DASH diet (dietary approaches to stop hypertension). This diet is rich in fruits, vegetables, and low-fat dairy products, and avoids certain fats.  PROGNOSIS  Essential hypertension cannot be cured. Lifestyle changes and medical treatment can lower blood pressure and reduce complications. The prognosis of secondary hypertension depends on the underlying cause. Many people whose hypertension is controlled with medicine or lifestyle changes can live a normal, healthy life.  RISKS AND COMPLICATIONS  While high blood pressure alone is not an illness, it often requires treatment due to its short- and long-term effects on many organs. Hypertension increases your risk for:  CVAs or strokes (cerebrovascular accident).   Heart failure due to chronically high blood pressure (hypertensive cardiomyopathy).   Heart attack (myocardial infarction).   Damage to the retina (hypertensive retinopathy).   Kidney failure (hypertensive nephropathy).  Your caregiver can  explain list items above that apply to you. Treatment of hypertension can significantly reduce the risk of complications. TREATMENT   For overweight patients, weight loss and regular exercise are recommended. Physical fitness lowers blood pressure.   Mild hypertension is usually treated with diet and exercise. A diet rich in fruits and vegetables, fat-free dairy products, and foods low in fat and salt (sodium) can help lower blood pressure. Decreasing salt intake decreases blood pressure in a 1/3 of people.   Stop smoking if you are a smoker.  The steps above are highly effective in reducing blood pressure. While these actions are easy to suggest, they are difficult to achieve. Most patients with moderate or severe hypertension end up requiring medications to bring their blood pressure down to a normal level. There are several classes of medications for treatment. Blood pressure pills (antihypertensives) will lower blood pressure by their different actions. Lowering the blood pressure by 10 mmHg may decrease the risk of complications by as much as 25%. The goal of treatment is effective blood pressure control. This will reduce your risk for complications. Your caregiver will help you determine the best treatment for you according to your lifestyle. What is excellent treatment for one person, may not be for you. HOME CARE INSTRUCTIONS   Do not smoke.   Follow the lifestyle changes outlined in the "Prevention" section.   If you are on medications, follow the directions  carefully. Blood pressure medications must be taken as prescribed. Skipping doses reduces their benefit. It also puts you at risk for problems.   Follow up with your caregiver, as directed.   If you are asked to monitor your blood pressure at home, follow the guidelines in the "Diagnosis" section above.  SEEK MEDICAL CARE IF:   You think you are having medication side effects.   You have recurrent headaches or lightheadedness.     You have swelling in your ankles.   You have trouble with your vision.  SEEK IMMEDIATE MEDICAL CARE IF:   You have sudden onset of chest pain or pressure, difficulty breathing, or other symptoms of a heart attack.   You have a severe headache.   You have symptoms of a stroke (such as sudden weakness, difficulty speaking, difficulty walking).  MAKE SURE YOU:   Understand these instructions.   Will watch your condition.   Will get help right away if you are not doing well or get worse.  Document Released: 09/10/2005 Document Revised: 08/30/2011 Document Reviewed: 04/10/2007 [facility] Patient Information 2012 [place], [place].

## 2012-06-12 NOTE — ED Notes (Signed)
Pt c/o h/a x 1 week,  Out of hctz 25mg 

## 2012-06-12 NOTE — ED Notes (Signed)
Reports she has not had her b/p medication in over a week and it is typical for her to get a headache when her b/p goes up.  This headache is no different.

## 2012-09-09 ENCOUNTER — Emergency Department (HOSPITAL_BASED_OUTPATIENT_CLINIC_OR_DEPARTMENT_OTHER)
Admission: EM | Admit: 2012-09-09 | Discharge: 2012-09-09 | Disposition: A | Discharge status: Home / Self Care | Payer: Self-pay | Attending: Emergency Medicine | Admitting: Emergency Medicine

## 2012-09-09 ENCOUNTER — Encounter (HOSPITAL_BASED_OUTPATIENT_CLINIC_OR_DEPARTMENT_OTHER): Payer: Self-pay

## 2012-09-09 DIAGNOSIS — Z79899 Other long term (current) drug therapy: Secondary | ICD-10-CM | POA: Insufficient documentation

## 2012-09-09 DIAGNOSIS — R5381 Other malaise: Secondary | ICD-10-CM | POA: Insufficient documentation

## 2012-09-09 DIAGNOSIS — Z3202 Encounter for pregnancy test, result negative: Secondary | ICD-10-CM | POA: Insufficient documentation

## 2012-09-09 DIAGNOSIS — I10 Essential (primary) hypertension: Secondary | ICD-10-CM | POA: Insufficient documentation

## 2012-09-09 DIAGNOSIS — R51 Headache: Secondary | ICD-10-CM | POA: Insufficient documentation

## 2012-09-09 DIAGNOSIS — R531 Weakness: Secondary | ICD-10-CM

## 2012-09-09 DIAGNOSIS — R5383 Other fatigue: Secondary | ICD-10-CM | POA: Insufficient documentation

## 2012-09-09 LAB — URINALYSIS, ROUTINE W REFLEX MICROSCOPIC
Glucose, UA: NEGATIVE mg/dL
Hgb urine dipstick: NEGATIVE
Leukocytes, UA: NEGATIVE
Specific Gravity, Urine: 1.017 (ref 1.005–1.030)
pH: 7 (ref 5.0–8.0)

## 2012-09-09 LAB — CBC WITH DIFFERENTIAL/PLATELET
Basophils Absolute: 0 10*3/uL (ref 0.0–0.1)
Basophils Relative: 0 % (ref 0–1)
HCT: 35.7 % — ABNORMAL LOW (ref 36.0–46.0)
Hemoglobin: 12.2 g/dL (ref 12.0–15.0)
Lymphocytes Relative: 41 % (ref 12–46)
MCHC: 34.2 g/dL (ref 30.0–36.0)
Monocytes Absolute: 0.7 10*3/uL (ref 0.1–1.0)
Monocytes Relative: 14 % — ABNORMAL HIGH (ref 3–12)
Neutro Abs: 2.2 10*3/uL (ref 1.7–7.7)
Neutrophils Relative %: 43 % (ref 43–77)
RBC: 4.03 MIL/uL (ref 3.87–5.11)
WBC: 5.1 10*3/uL (ref 4.0–10.5)

## 2012-09-09 LAB — BASIC METABOLIC PANEL
BUN: 9 mg/dL (ref 6–23)
CO2: 27 mEq/L (ref 19–32)
Chloride: 101 mEq/L (ref 96–112)
Creatinine, Ser: 0.8 mg/dL (ref 0.50–1.10)
GFR calc Af Amer: >90 mL/min (ref >90)
Potassium: 3.7 mEq/L (ref 3.5–5.1)

## 2012-09-09 LAB — PREGNANCY, URINE: Preg Test, Ur: NEGATIVE

## 2012-09-09 MED ORDER — HYDROCHLOROTHIAZIDE 25 MG PO TABS: 25.0000 mg | ORAL_TABLET | Freq: Every day | ORAL | Status: DC

## 2012-09-09 MED ORDER — IBUPROFEN 800 MG PO TABS
800.0000 mg | ORAL_TABLET | Freq: Once | ORAL | Status: AC
  Administered 2012-09-09: 800 mg via ORAL
  Filled 2012-09-09: qty 1

## 2012-09-09 MED ORDER — IBUPROFEN 800 MG PO TABS: 800.0000 mg | ORAL_TABLET | Freq: Three times a day (TID) | ORAL | Status: DC

## 2012-09-09 NOTE — ED Notes (Signed)
Pt reports she awakened with a headache this morning, went to work and states she felt like she was going to pass out.

## 2012-09-09 NOTE — ED Provider Notes (Signed)
History     CSN: [id number]  Arrival date & time 09/09/12  1422   First MD Initiated Contact with Patient 09/09/12 1502      Chief Complaint  Patient presents with  . Weakness    (Consider location/radiation/quality/duration/timing/severity/associated sxs/prior treatment) Patient is a 21 y.o. female presenting with weakness. The history is provided by the patient. No language interpreter was used.  Weakness The primary symptoms include headaches. Primary symptoms do not include dizziness, nausea or vomiting. The symptoms began 2 to 6 hours ago. The symptoms are improving. The neurological symptoms are diffuse. Context: trouble sleeping.  The headache is associated with weakness.  Additional symptoms include weakness. Medical issues do not include diabetes.   Pt reports she feels tired and weak today.  Pt reports she did not sleep well last night.  Pt thinks she snored really bad.  Pt reports her throat was sore.   Past Medical History  Diagnosis Date  . Hypertension     History reviewed. No pertinent past surgical history.  No family history on file.  History  Substance Use Topics  . Smoking status: Never Smoker   . Smokeless tobacco: Not on file  . Alcohol Use: No    OB History    Grav Para Term Preterm Abortions TAB SAB Ect Mult Living                  Review of Systems  Gastrointestinal: Negative for nausea and vomiting.  Neurological: Positive for weakness and headaches. Negative for dizziness.  All other systems reviewed and are negative.    Allergies  Review of patient's allergies indicates no known allergies.  Home Medications   Current Outpatient Rx  Name  Route  Sig  Dispense  Refill  . HYDROCHLOROTHIAZIDE 25 MG PO TABS   Oral   Take 1 tablet (25 mg total) by mouth daily.   30 tablet   0   . HYDROCHLOROTHIAZIDE 25 MG PO TABS   Oral   Take 1 tablet (25 mg total) by mouth daily.   30 tablet   1     BP 146/95  Pulse 103  Temp 98.9 F  (37.2 C)  Resp 16  Ht 5\' 4"  (1.626 m)  Wt 250 lb (113.399 kg)  BMI 42.91 kg/m2  SpO2 100%  LMP 07/31/2012  Physical Exam  Nursing note and vitals reviewed. Constitutional: She is oriented to person, place, and time. She appears well-developed and well-nourished.  HENT:  Head: Normocephalic and atraumatic.  Eyes: Conjunctivae normal are normal. Pupils are equal, round, and reactive to light.  Neck: Normal range of motion. Neck supple.  Cardiovascular: Normal rate and normal heart sounds.   Pulmonary/Chest: Effort normal and breath sounds normal.  Abdominal: Soft. Bowel sounds are normal.  Musculoskeletal: Normal range of motion.  Neurological: She is alert and oriented to person, place, and time. She has normal reflexes.  Skin: Skin is warm.  Psychiatric: She has a normal mood and affect.    ED Course  Procedures (including critical care time)   Labs Reviewed  URINALYSIS, ROUTINE W REFLEX MICROSCOPIC  PREGNANCY, URINE  CBC WITH DIFFERENTIAL  BASIC METABOLIC PANEL   No results found.   1. Weakness   2. Headache       MDM   Results for orders placed during the hospital encounter of 09/09/12  URINALYSIS, ROUTINE W REFLEX MICROSCOPIC      Component Value Range   Color, Urine YELLOW  YELLOW   APPearance  CLEAR  CLEAR   Specific Gravity, Urine 1.017  1.005 - 1.030   pH 7.0  5.0 - 8.0   Glucose, UA NEGATIVE  NEGATIVE mg/dL   Hgb urine dipstick NEGATIVE  NEGATIVE   Bilirubin Urine NEGATIVE  NEGATIVE   Ketones, ur NEGATIVE  NEGATIVE mg/dL   Protein, ur NEGATIVE  NEGATIVE mg/dL   Urobilinogen, UA 1.0  0.0 - 1.0 mg/dL   Nitrite NEGATIVE  NEGATIVE   Leukocytes, UA NEGATIVE  NEGATIVE  PREGNANCY, URINE      Component Value Range   Preg Test, Ur NEGATIVE  NEGATIVE  CBC WITH DIFFERENTIAL      Component Value Range   WBC 5.1  4.0 - 10.5 K/uL   RBC 4.03  3.87 - 5.11 MIL/uL   Hemoglobin 12.2  12.0 - 15.0 g/dL   HCT 16.1 (*) 09.6 - 04.5 %   MCV 88.6  78.0 - 100.0 fL    MCH 30.3  26.0 - 34.0 pg   MCHC 34.2  30.0 - 36.0 g/dL   RDW 40.9  81.1 - 91.4 %   Platelets 236  150 - 400 K/uL   Neutrophils Relative 43  43 - 77 %   Neutro Abs 2.2  1.7 - 7.7 K/uL   Lymphocytes Relative 41  12 - 46 %   Lymphs Abs 2.1  0.7 - 4.0 K/uL   Monocytes Relative 14 (*) 3 - 12 %   Monocytes Absolute 0.7  0.1 - 1.0 K/uL   Eosinophils Relative 2  0 - 5 %   Eosinophils Absolute 0.1  0.0 - 0.7 K/uL   Basophils Relative 0  0 - 1 %   Basophils Absolute 0.0  0.0 - 0.1 K/uL   No results found.    Pt ibuprofen for headache.   Pt given referral sheet for primary care     Lonia Skinner [place], [place] 09/09/12 1559  Lonia Skinner [place], [place] 09/09/12 1600  Lonia Skinner [place], [place] 09/09/12 [phone]

## 2012-09-09 NOTE — ED Notes (Signed)
C/o weakness while at work today-felt like she was going to pass out-reports woke with HA this am

## 2012-09-11 NOTE — ED Provider Notes (Signed)
Medical screening examination/treatment/procedure(s) were performed by non-physician practitioner and as supervising physician I was immediately available for consultation/collaboration.   Timera Windt, MD 09/11/12 1612 

## 2012-10-14 ENCOUNTER — Emergency Department (HOSPITAL_BASED_OUTPATIENT_CLINIC_OR_DEPARTMENT_OTHER)
Admission: EM | Admit: 2012-10-14 | Discharge: 2012-10-14 | Disposition: A | Discharge status: Home / Self Care | Payer: Self-pay | Attending: Emergency Medicine | Admitting: Emergency Medicine

## 2012-10-14 ENCOUNTER — Encounter (HOSPITAL_BASED_OUTPATIENT_CLINIC_OR_DEPARTMENT_OTHER): Payer: Self-pay | Admitting: Emergency Medicine

## 2012-10-14 DIAGNOSIS — I10 Essential (primary) hypertension: Secondary | ICD-10-CM | POA: Insufficient documentation

## 2012-10-14 DIAGNOSIS — Z791 Long term (current) use of non-steroidal anti-inflammatories (NSAID): Secondary | ICD-10-CM | POA: Insufficient documentation

## 2012-10-14 DIAGNOSIS — Z79899 Other long term (current) drug therapy: Secondary | ICD-10-CM | POA: Insufficient documentation

## 2012-10-14 NOTE — ED Notes (Signed)
Pt c/o headache. Pt has been out of blood pressure medication. Pt picked up medication from pharmacy on the way to ED but has not taken it. Pt took ibuprofen at home for headache.

## 2012-10-14 NOTE — ED Provider Notes (Signed)
History     CSN: [id number]  Arrival date & time 10/14/12  0408   First MD Initiated Contact with Patient 10/14/12 [phone]      Chief Complaint  Patient presents with  . Headache    (Consider location/radiation/quality/duration/timing/severity/associated sxs/prior treatment) HPI This is a 22 year old female with a history of hypertension. She has not been taking her hydrochlorothiazide as prescribed. She will awoke about an hour ago with a headache which she states is moderate to severe. There was no associated neurologic deficit, visual changes, nausea or vomiting. She took ibuprofen with significant relief. Her headache is now a 2/10. On the way to the ED she stop by the drug store and had her hydrochlorothiazide filled. She did not take any out of concern that it may interact inappropriately with the ibuprofen. She is not having any chest pain or shortness of breath. She came here primarily to find out how high her blood pressure was before taking her hydrochlorothiazide.  Past Medical History  Diagnosis Date  . Hypertension     History reviewed. No pertinent past surgical history.  No family history on file.  History  Substance Use Topics  . Smoking status: Never Smoker   . Smokeless tobacco: Not on file  . Alcohol Use: No    OB History    Grav Para Term Preterm Abortions TAB SAB Ect Mult Living                  Review of Systems  All other systems reviewed and are negative.    Allergies  Review of patient's allergies indicates no known allergies.  Home Medications   Current Outpatient Rx  Name  Route  Sig  Dispense  Refill  . HYDROCHLOROTHIAZIDE 25 MG PO TABS   Oral   Take 1 tablet (25 mg total) by mouth daily.   30 tablet   0   . IBUPROFEN 400 MG PO TABS   Oral   Take 400 mg by mouth as needed.         Marland Kitchen HYDROCHLOROTHIAZIDE 25 MG PO TABS   Oral   Take 1 tablet (25 mg total) by mouth daily.   30 tablet   2   . IBUPROFEN 800 MG PO TABS   Oral  Take 1 tablet (800 mg total) by mouth 3 (three) times daily.   21 tablet   0     BP 160/100  Pulse 97  Temp 98.3 F (36.8 C) (Oral)  Resp 18  SpO2 100%  Physical Exam General: Well-developed, well-nourished female in no acute distress; appearance consistent with age of record HENT: normocephalic, atraumatic Eyes: pupils equal round and reactive to light; extraocular muscles intact; no retinal hemorrhage or papilledema seen Neck: supple Heart: regular rate and rhythm; no murmurs, rubs or gallops Lungs: clear to auscultation bilaterally Abdomen: soft; nondistended Extremities: No deformity; full range of motion Neurologic: Awake, alert and oriented; motor function intact in all extremities and symmetric; no facial droop Skin: Warm and dry Psychiatric: Normal mood and affect    ED Course  Procedures (including critical care time)    MDM  Patient was advised of the importance of taking her blood pressure medication regularly. She was advised that aggressive treatment in the absence of evidence of end-organ failure is not indicated.        Hanley Seamen, MD 10/14/12 0430

## 2012-11-21 ENCOUNTER — Emergency Department (HOSPITAL_COMMUNITY): Payer: Self-pay

## 2012-11-21 ENCOUNTER — Emergency Department (HOSPITAL_COMMUNITY)
Admission: EM | Admit: 2012-11-21 | Discharge: 2012-11-21 | Disposition: A | Discharge status: Home / Self Care | Payer: Self-pay | Attending: Emergency Medicine | Admitting: Emergency Medicine

## 2012-11-21 DIAGNOSIS — I10 Essential (primary) hypertension: Secondary | ICD-10-CM | POA: Insufficient documentation

## 2012-11-21 DIAGNOSIS — R079 Chest pain, unspecified: Secondary | ICD-10-CM | POA: Insufficient documentation

## 2012-11-21 DIAGNOSIS — Z79899 Other long term (current) drug therapy: Secondary | ICD-10-CM | POA: Insufficient documentation

## 2012-11-21 LAB — TROPONIN I: Troponin I: <0.3 ng/mL (ref <0.30)

## 2012-11-21 LAB — CBC
HCT: 40.1 % (ref 36.0–46.0)
Hemoglobin: 13.2 g/dL (ref 12.0–15.0)
MCH: 29.1 pg (ref 26.0–34.0)
MCHC: 32.9 g/dL (ref 30.0–36.0)
MCV: 88.5 fL (ref 78.0–100.0)
RBC: 4.53 MIL/uL (ref 3.87–5.11)

## 2012-11-21 LAB — BASIC METABOLIC PANEL
BUN: 10 mg/dL (ref 6–23)
CO2: 29 mEq/L (ref 19–32)
Calcium: 9.1 mg/dL (ref 8.4–10.5)
GFR calc non Af Amer: >90 mL/min (ref >90)
Glucose, Bld: 86 mg/dL (ref 70–99)
Sodium: 136 mEq/L (ref 135–145)

## 2012-11-21 MED ORDER — IBUPROFEN 600 MG PO TABS: 600.0000 mg | ORAL_TABLET | Freq: Four times a day (QID) | ORAL | Status: DC | PRN

## 2012-11-21 NOTE — ED Provider Notes (Signed)
History     CSN: [id number]  Arrival date & time 11/21/12  1845   First MD Initiated Contact with Patient 11/21/12 1911      Chief Complaint  Patient presents with  . Chest Pain    (Consider location/radiation/quality/duration/timing/severity/associated sxs/prior treatment) HPI Pt presents with c/o chest pain under left breast which has been intermittent over the past 2 weeks.  Pt states the pain comes on and is sharp and severe, then goes away without treatment.  She cannot say what makes it better or worse, not associated with exertion.  No sob, no cough or fever or chills.  No leg swelling.  She has not tried anything for the symptoms.  She denies hormone use, recent surgery or trauma or travel.  No hx of DVT/PE.  There are no other associated systemic symptoms, there are no other alleviating or modifying factors.   Past Medical History  Diagnosis Date  . Hypertension     No past surgical history on file.  No family history on file.  History  Substance Use Topics  . Smoking status: Never Smoker   . Smokeless tobacco: Not on file  . Alcohol Use: No    OB History   Grav Para Term Preterm Abortions TAB SAB Ect Mult Living                  Review of Systems ROS reviewed and all otherwise negative except for mentioned in HPI  Allergies  Review of patient's allergies indicates no known allergies.  Home Medications   Current Outpatient Rx  Name  Route  Sig  Dispense  Refill  . hydrochlorothiazide (HYDRODIURIL) 25 MG tablet   Oral   Take 25 mg by mouth every morning.         Marland Kitchen ibuprofen (ADVIL,MOTRIN) 600 MG tablet   Oral   Take 1 tablet (600 mg total) by mouth every 6 (six) hours as needed for pain.   30 tablet   0     BP 113/65  Pulse 99  Temp(Src) 97.8 F (36.6 C) (Oral)  Resp 17  SpO2 99%  LMP 08/21/2012 Vitals reviewed Physical Exam Physical Examination: General appearance - alert, well appearing, and in no distress Mental status - alert,  oriented to person, place, and time Eyes - no scleral icterus, no conjunctival injection Mouth - mucous membranes moist, pharynx normal without lesions Chest - clear to auscultation, no wheezes, rales or rhonchi, symmetric air entry, reproducible tenderness at left lower sternochostral junction.  Heart - normal rate, regular rhythm, normal S1, S2, no murmurs, rubs, clicks or gallops Abdomen - soft, nontender, nondistended, no masses or organomegaly, nabs Extremities - peripheral pulses normal, no pedal edema, no clubbing or cyanosis Skin - normal coloration and turgor, no rashes  ED Course  Procedures (including critical care time)   Date: 11/21/2012  Rate: 115  Rhythm: sinus tachycardia  QRS Axis: normal  Intervals: normal  ST/T Wave abnormalities: normal  Conduction Disutrbances:none  Narrative Interpretation:   Old EKG Reviewed: none available   Labs Reviewed  BASIC METABOLIC PANEL - Abnormal; Notable for the following:    Potassium 3.3 (*)    All other components within normal limits  CBC  D-DIMER, QUANTITATIVE  TROPONIN I   Dg Chest 2 View  11/21/2012  *RADIOLOGY REPORT*  Clinical Data: Chest pain, shortness of breath.  CHEST - 2 VIEW  Comparison: 08/27/2011  Findings: Heart and mediastinal contours are within normal limits. No focal opacities or effusions.  No acute bony abnormality.  IMPRESSION: Normal study.   Original Report Authenticated By: Charlett Nose, M.D.      1. Chest pain       MDM  Pt presenting with c/o chest pain, pain is reproducible on exam, CXR and EKG are reassuring.  Pt with mild tachycardia, so d-dimer obtained- which was reassuring.  Pt started on ibuprofen. Suspect costochondritis  Discharged with strict return precautions.  Pt agreeable with plan.        Ethelda Chick, MD 11/22/12 [phone]

## 2012-11-21 NOTE — ED Notes (Signed)
Pt reports chest pain x2 weeks. Pain increased today, pain is intermittent. Upper abdominal/ lower central chest 10/10. Pain increases during inspiration. Hx of HTN, reports increased SOB with ambulation. Denies n/v/d.

## 2012-11-21 NOTE — ED Notes (Signed)
Assessed pt. for chest pain, denies at this time. Family at bedside.

## 2013-01-11 ENCOUNTER — Encounter (HOSPITAL_BASED_OUTPATIENT_CLINIC_OR_DEPARTMENT_OTHER): Payer: Self-pay | Admitting: *Deleted

## 2013-01-11 ENCOUNTER — Emergency Department (HOSPITAL_BASED_OUTPATIENT_CLINIC_OR_DEPARTMENT_OTHER)
Admission: EM | Admit: 2013-01-11 | Discharge: 2013-01-12 | Disposition: A | Discharge status: Home / Self Care | Payer: Self-pay | Attending: Emergency Medicine | Admitting: Emergency Medicine

## 2013-01-11 DIAGNOSIS — R51 Headache: Secondary | ICD-10-CM | POA: Insufficient documentation

## 2013-01-11 DIAGNOSIS — H53149 Visual discomfort, unspecified: Secondary | ICD-10-CM | POA: Insufficient documentation

## 2013-01-11 DIAGNOSIS — Z3202 Encounter for pregnancy test, result negative: Secondary | ICD-10-CM | POA: Insufficient documentation

## 2013-01-11 DIAGNOSIS — I10 Essential (primary) hypertension: Secondary | ICD-10-CM | POA: Insufficient documentation

## 2013-01-11 DIAGNOSIS — R110 Nausea: Secondary | ICD-10-CM | POA: Insufficient documentation

## 2013-01-11 DIAGNOSIS — Z760 Encounter for issue of repeat prescription: Secondary | ICD-10-CM | POA: Insufficient documentation

## 2013-01-11 MED ORDER — DIPHENHYDRAMINE HCL 50 MG/ML IJ SOLN
25.0000 mg | Freq: Once | INTRAMUSCULAR | Status: AC
  Administered 2013-01-11: 25 mg via INTRAVENOUS
  Filled 2013-01-11: qty 1

## 2013-01-11 MED ORDER — METOCLOPRAMIDE HCL 5 MG/ML IJ SOLN
10.0000 mg | Freq: Once | INTRAMUSCULAR | Status: AC
  Administered 2013-01-11: 10 mg via INTRAVENOUS
  Filled 2013-01-11: qty 2

## 2013-01-11 MED ORDER — KETOROLAC TROMETHAMINE 30 MG/ML IJ SOLN
30.0000 mg | Freq: Once | INTRAMUSCULAR | Status: AC
  Administered 2013-01-11: 30 mg via INTRAVENOUS
  Filled 2013-01-11: qty 1

## 2013-01-11 MED ORDER — SODIUM CHLORIDE 0.9 % IV BOLUS (SEPSIS)
1000.0000 mL | Freq: Once | INTRAVENOUS | Status: AC
  Administered 2013-01-11: 1000 mL via INTRAVENOUS

## 2013-01-11 MED ORDER — HYDROCHLOROTHIAZIDE 12.5 MG PO TABS: 25.0000 mg | ORAL_TABLET | Freq: Every day | ORAL | Status: DC

## 2013-01-11 NOTE — ED Provider Notes (Signed)
History     CSN: [id number]  Arrival date & time 01/11/13  2024   First MD Initiated Contact with Patient 01/11/13 2050      Chief Complaint  Patient presents with  . Headache    (Consider location/radiation/quality/duration/timing/severity/associated sxs/prior treatment) HPI Comments: Patient is a 22 year old female with a past medical history of hypertension who presents with a headache for several weeks. Patient reports a gradual onset and progressive worsening of the headache. The pain is sharp, constant and is located in generalized head without radiation. Patient has tried ibuprofen for symptoms which has been helping but today did not. No alleviating/aggravating factors. Patient reports associated nausea and photophobia. Patient denies fever, vomiting, diarrhea, numbness/tingling, weakness, visual changes, congestion, chest pain, SOB, abdominal pain. Patient reports headaches are typical when she runs out of her HCTZ. She has been out of her medication for 3 weeks and would like a refill.     Patient is a 22 y.o. female presenting with headaches.  Headache   Past Medical History  Diagnosis Date  . Hypertension     History reviewed. No pertinent past surgical history.  No family history on file.  History  Substance Use Topics  . Smoking status: Never Smoker   . Smokeless tobacco: Not on file  . Alcohol Use: No    OB History   Grav Para Term Preterm Abortions TAB SAB Ect Mult Living                  Review of Systems  Neurological: Positive for headaches.  All other systems reviewed and are negative.    Allergies  Review of patient's allergies indicates no known allergies.  Home Medications   Current Outpatient Rx  Name  Route  Sig  Dispense  Refill  . hydrochlorothiazide (HYDRODIURIL) 25 MG tablet   Oral   Take 25 mg by mouth every morning. Has not been taking for past 3 weeks         . ibuprofen (ADVIL,MOTRIN) 600 MG tablet   Oral   Take 1  tablet (600 mg total) by mouth every 6 (six) hours as needed for pain.   30 tablet   0     BP 121/78  Pulse 91  Temp(Src) 98.5 F (36.9 C) (Oral)  Resp 16  Ht 5\' 3"  (1.6 m)  Wt 245 lb (111.131 kg)  BMI 43.41 kg/m2  SpO2 100%  LMP 08/13/2012  Physical Exam  Nursing note and vitals reviewed. Constitutional: She appears well-developed and well-nourished. No distress.  HENT:  Head: Normocephalic and atraumatic.  Eyes: Conjunctivae are normal.  Neck: Normal range of motion.  Cardiovascular: Normal rate and regular rhythm.  Exam reveals no gallop and no friction rub.   No murmur heard. Pulmonary/Chest: Effort normal and breath sounds normal. She has no wheezes. She has no rales. She exhibits no tenderness.  Abdominal: Soft. There is no tenderness.  Musculoskeletal: Normal range of motion.  Neurological: She is alert.  Speech is goal-oriented. Moves limbs without ataxia.   Skin: Skin is warm and dry.  Psychiatric: She has a normal mood and affect. Her behavior is normal.    ED Course  Procedures (including critical care time)  Labs Reviewed  PREGNANCY, URINE   No results found.   1. Headache   2. Medication refill       MDM  10:44 PM Patient reports improvement of her headache with toradol, reglan, and benadryl. Patient will have a refill of HCTZ.  Vitals stable and patient is afebrile. No further evaluation needed at this time. Patient instructed to return to the ED with worsening or concerning symptoms.         Emilia Beck, PA-C 01/11/13 2249

## 2013-01-11 NOTE — ED Notes (Signed)
Pt states she has not been taking her b/p meds for the past 3 weeks due to cost. C/o general h/a that started the past several weeks but today. States ibuprofen has not been relieving the pain. Similar pain when she is out of her meds. Denies any visual problems. Denies any n/v. C/o light sensitivity.

## 2013-01-12 NOTE — ED Provider Notes (Signed)
Medical screening examination/treatment/procedure(s) were performed by non-physician practitioner and as supervising physician I was immediately available for consultation/collaboration.   Carleene Cooper III, MD 01/12/13 [phone]

## 2013-02-05 ENCOUNTER — Encounter (HOSPITAL_COMMUNITY): Payer: Self-pay | Admitting: Emergency Medicine

## 2013-02-05 ENCOUNTER — Emergency Department (HOSPITAL_COMMUNITY)
Admission: EM | Admit: 2013-02-05 | Discharge: 2013-02-05 | Disposition: A | Discharge status: Home / Self Care | Payer: Self-pay | Attending: Emergency Medicine | Admitting: Emergency Medicine

## 2013-02-05 DIAGNOSIS — Z79899 Other long term (current) drug therapy: Secondary | ICD-10-CM | POA: Insufficient documentation

## 2013-02-05 DIAGNOSIS — R51 Headache: Secondary | ICD-10-CM | POA: Insufficient documentation

## 2013-02-05 DIAGNOSIS — H9209 Otalgia, unspecified ear: Secondary | ICD-10-CM | POA: Insufficient documentation

## 2013-02-05 DIAGNOSIS — R509 Fever, unspecified: Secondary | ICD-10-CM | POA: Insufficient documentation

## 2013-02-05 DIAGNOSIS — J0300 Acute streptococcal tonsillitis, unspecified: Secondary | ICD-10-CM

## 2013-02-05 DIAGNOSIS — IMO0001 Reserved for inherently not codable concepts without codable children: Secondary | ICD-10-CM | POA: Insufficient documentation

## 2013-02-05 DIAGNOSIS — I10 Essential (primary) hypertension: Secondary | ICD-10-CM | POA: Insufficient documentation

## 2013-02-05 DIAGNOSIS — J020 Streptococcal pharyngitis: Secondary | ICD-10-CM | POA: Insufficient documentation

## 2013-02-05 MED ORDER — IBUPROFEN 400 MG PO TABS
400.0000 mg | ORAL_TABLET | Freq: Once | ORAL | Status: AC
  Administered 2013-02-05: 400 mg via ORAL
  Filled 2013-02-05: qty 1

## 2013-02-05 MED ORDER — PENICILLIN V POTASSIUM 500 MG PO TABS: 500.0000 mg | ORAL_TABLET | Freq: Two times a day (BID) | ORAL | Status: AC

## 2013-02-05 MED ORDER — ACETAMINOPHEN 325 MG PO TABS
650.0000 mg | ORAL_TABLET | Freq: Once | ORAL | Status: AC
  Administered 2013-02-05: 650 mg via ORAL
  Filled 2013-02-05: qty 2

## 2013-02-05 MED ORDER — IBUPROFEN 600 MG PO TABS: 600.0000 mg | ORAL_TABLET | Freq: Four times a day (QID) | ORAL | Status: DC | PRN

## 2013-02-05 NOTE — ED Notes (Signed)
PT. REPORTS SORE THROAT WITH RIGHT EAR ACHE /HEADACHE FOR SEVERAL DAYS . FEBRILE AT TRIAGE .

## 2013-02-05 NOTE — ED Provider Notes (Signed)
History     CSN: [id number]  Arrival date & time 02/05/13  0157   First MD Initiated Contact with Patient 02/05/13 0301      Chief Complaint  Patient presents with  . Sore Throat  . Headache  . Otalgia    (Consider location/radiation/quality/duration/timing/severity/associated sxs/prior treatment) HPI Pt with several days of sore throat, fever, ear pain, generalized HA. Pt with recurrent strep infection. No voice changes, trouble breathing. No cough, SOB. No neck stiffness, focal weakness/numbness.  Past Medical History  Diagnosis Date  . Hypertension     History reviewed. No pertinent past surgical history.  No family history on file.  History  Substance Use Topics  . Smoking status: Never Smoker   . Smokeless tobacco: Not on file  . Alcohol Use: No    OB History   Grav Para Term Preterm Abortions TAB SAB Ect Mult Living                  Review of Systems  Constitutional: Positive for fever and fatigue.  HENT: Positive for ear pain and sore throat. Negative for congestion, rhinorrhea, trouble swallowing, neck pain, neck stiffness and voice change.   Respiratory: Negative for shortness of breath and wheezing.   Cardiovascular: Negative for chest pain.  Gastrointestinal: Negative for nausea, vomiting, abdominal pain and diarrhea.  Musculoskeletal: Positive for myalgias.  Skin: Negative for rash and wound.  Neurological: Positive for headaches. Negative for dizziness, weakness, light-headedness and numbness.  All other systems reviewed and are negative.    Allergies  Review of patient's allergies indicates no known allergies.  Home Medications   Current Outpatient Rx  Name  Route  Sig  Dispense  Refill  . hydrochlorothiazide (HYDRODIURIL) 25 MG tablet   Oral   Take 25 mg by mouth every morning. Has not been taking for past 3 weeks         . ibuprofen (ADVIL,MOTRIN) 600 MG tablet   Oral   Take 1 tablet (600 mg total) by mouth every 6 (six) hours as  needed for pain.   30 tablet   0   . penicillin v potassium (VEETID) 500 MG tablet   Oral   Take 1 tablet (500 mg total) by mouth 2 (two) times daily.   20 tablet   0     BP 129/87  Pulse 102  Temp(Src) 101.3 F (38.5 C) (Oral)  Resp 14  SpO2 97%  LMP 08/13/2012  Physical Exam  Nursing note and vitals reviewed. Constitutional: She is oriented to person, place, and time. She appears well-developed and well-nourished. No distress.  HENT:  Head: Normocephalic and atraumatic.  Mouth/Throat: Oropharyngeal exudate (tosilar enargement with eyrthema and exudates. No uvular deviation. ) present.  bl bulging TM's  Eyes: EOM are normal. Pupils are equal, round, and reactive to light.  Neck: Normal range of motion. Neck supple.  No meningismus    Cardiovascular: Normal rate and regular rhythm.   Pulmonary/Chest: Effort normal and breath sounds normal. No respiratory distress. She has no wheezes. She has no rales.  Abdominal: Soft. Bowel sounds are normal. She exhibits no distension and no mass. There is no tenderness. There is no rebound and no guarding.  Musculoskeletal: Normal range of motion. She exhibits no edema and no tenderness.  Lymphadenopathy:    She has cervical adenopathy.  Neurological: She is alert and oriented to person, place, and time.  5/5 motor, sensation intact  Skin: Skin is warm and dry. No rash noted. No  erythema.  Psychiatric: She has a normal mood and affect. Her behavior is normal.    ED Course  Procedures (including critical care time)  Labs Reviewed  RAPID STREP SCREEN - Abnormal; Notable for the following:    Streptococcus, Group A Screen (Direct) POSITIVE (*)    All other components within normal limits   No results found.   1. Strep tonsillitis       MDM  Offered IM abx. Pt refused. No airway compromise. Based on recurrence of strep infections, advised f/u with ENT for possible tonsillectomy. Return precautions given.          Loren Racer, MD 02/05/13 [phone]

## 2013-02-05 NOTE — ED Notes (Signed)
MD at bedside. 

## 2013-07-07 ENCOUNTER — Ambulatory Visit: Payer: Self-pay | Admitting: Family Medicine

## 2013-10-01 ENCOUNTER — Emergency Department (HOSPITAL_BASED_OUTPATIENT_CLINIC_OR_DEPARTMENT_OTHER)
Admission: EM | Admit: 2013-10-01 | Discharge: 2013-10-01 | Disposition: A | Discharge status: Home / Self Care | Payer: PRIVATE HEALTH INSURANCE | Attending: Emergency Medicine | Admitting: Emergency Medicine

## 2013-10-01 ENCOUNTER — Encounter (HOSPITAL_BASED_OUTPATIENT_CLINIC_OR_DEPARTMENT_OTHER): Payer: Self-pay | Admitting: Emergency Medicine

## 2013-10-01 DIAGNOSIS — H9209 Otalgia, unspecified ear: Secondary | ICD-10-CM | POA: Insufficient documentation

## 2013-10-01 DIAGNOSIS — I10 Essential (primary) hypertension: Secondary | ICD-10-CM | POA: Insufficient documentation

## 2013-10-01 DIAGNOSIS — R110 Nausea: Secondary | ICD-10-CM | POA: Insufficient documentation

## 2013-10-01 DIAGNOSIS — R69 Illness, unspecified: Secondary | ICD-10-CM

## 2013-10-01 DIAGNOSIS — J111 Influenza due to unidentified influenza virus with other respiratory manifestations: Secondary | ICD-10-CM | POA: Insufficient documentation

## 2013-10-01 DIAGNOSIS — IMO0001 Reserved for inherently not codable concepts without codable children: Secondary | ICD-10-CM | POA: Insufficient documentation

## 2013-10-01 LAB — RAPID STREP SCREEN (MED CTR MEBANE ONLY): Streptococcus, Group A Screen (Direct): NEGATIVE

## 2013-10-01 MED ORDER — OSELTAMIVIR PHOSPHATE 75 MG PO CAPS: 75.0000 mg | ORAL_CAPSULE | Freq: Two times a day (BID) | ORAL | Status: DC

## 2013-10-01 MED ORDER — PROMETHAZINE HCL 25 MG PO TABS: 25.0000 mg | ORAL_TABLET | Freq: Four times a day (QID) | ORAL | Status: DC | PRN

## 2013-10-01 MED ORDER — NAPROXEN 500 MG PO TABS: 500.0000 mg | ORAL_TABLET | Freq: Two times a day (BID) | ORAL | Status: DC

## 2013-10-01 NOTE — ED Notes (Signed)
C/o cold symptoms and sore throat x 3 days

## 2013-10-01 NOTE — Discharge Instructions (Signed)
Please call your doctor for a followup appointment within 24-48 hours. When you talk to your doctor please let them know that you were seen in the emergency department and have them acquire all of your records so that they can discuss the findings with you and formulate a treatment plan to fully care for your new and ongoing problems. ° °

## 2013-10-01 NOTE — ED Notes (Signed)
Pt c/o sore throat and fever x2 days.  

## 2013-10-01 NOTE — ED Provider Notes (Signed)
CSN: [id number]     Arrival date & time 10/01/13  2248 History  This chart was scribed for Vida RollerBrian D Caffie Sotto, MD by Ardelia Memsylan Malpass, ED Scribe. This patient was seen in room MH12/MH12 and the patient's care was started at 11:44 PM.   Chief Complaint  Patient presents with  . Sore Throat    The history is provided by the patient. No language interpreter was used.    HPI Comments: Jill Clark is a 23 y.o. Female with a history of HTN who presents to the Emergency Department complaining of a gradually worsening sore throat over the past 2-3 days. She reports associated nasal congestion and postnasal drip. She further reports associated myalgias, subjective fever and mild bilateral ear pain. She denies chills or any other symptoms.  Nothing seems to make this better or worse, no associated dysuria or cough   Past Medical History  Diagnosis Date  . Hypertension    History reviewed. No pertinent past surgical history. History reviewed. No pertinent family history. History  Substance Use Topics  . Smoking status: Never Smoker   . Smokeless tobacco: Not on file  . Alcohol Use: No   OB History   Grav Para Term Preterm Abortions TAB SAB Ect Mult Living                 Review of Systems  Constitutional: Positive for fever (subjective). Negative for chills.  HENT: Positive for congestion, ear pain (mild bilateral), postnasal drip and sore throat.   Gastrointestinal: Positive for nausea ( states that nasal drip makes her nauseated).  Musculoskeletal: Positive for myalgias.  All other systems reviewed and are negative.   Allergies  Review of patient's allergies indicates no known allergies.  Home Medications   Current Outpatient Rx  Name  Route  Sig  Dispense  Refill  . hydrochlorothiazide (HYDRODIURIL) 25 MG tablet   Oral   Take 25 mg by mouth every morning. Has not been taking for past 3 weeks         . ibuprofen (ADVIL,MOTRIN) 600 MG tablet   Oral   Take 1 tablet (600 mg total)  by mouth every 6 (six) hours as needed for pain.   30 tablet   0   . naproxen (NAPROSYN) 500 MG tablet   Oral   Take 1 tablet (500 mg total) by mouth 2 (two) times daily with a meal.   30 tablet   0   . oseltamivir (TAMIFLU) 75 MG capsule   Oral   Take 1 capsule (75 mg total) by mouth every 12 (twelve) hours.   10 capsule   0   . promethazine (PHENERGAN) 25 MG tablet   Oral   Take 1 tablet (25 mg total) by mouth every 6 (six) hours as needed for nausea or vomiting.   12 tablet   0    Triage Vitals: BP 133/82  Pulse 99  Temp(Src) 98.9 F (37.2 C)  Resp 16  Ht 5\' 4"  (1.626 m)  Wt 230 lb (104.327 kg)  BMI 39.46 kg/m2  SpO2 100%  LMP 10/01/2013  Physical Exam  Nursing note and vitals reviewed. Constitutional: She is oriented to person, place, and time. She appears well-developed and well-nourished. No distress.  HENT:  Head: Normocephalic and atraumatic.  Right Ear: External ear normal.  Left Ear: External ear normal.  Nose: Nose normal.  Mouth/Throat: Oropharynx is clear and moist. No oropharyngeal exudate.  Non-tender sinuses. Tympanic membranes normal bilaterally, oropharynx without exudate asymmetry  hypertrophy or erythema, moist mucous membranes, nasal passages are clear  Eyes: Conjunctivae and EOM are normal.  Neck: Normal range of motion. Neck supple.  No meningeal signs  Cardiovascular: Normal rate, regular rhythm and normal heart sounds.  Exam reveals no gallop and no friction rub.   No murmur heard. Pulmonary/Chest: Effort normal and breath sounds normal. No respiratory distress. She has no wheezes. She has no rales. She exhibits no tenderness.  Musculoskeletal: Normal range of motion. She exhibits no edema and no tenderness.  Lymphadenopathy:    She has no cervical adenopathy.  Neurological: She is alert and oriented to person, place, and time. No cranial nerve deficit.  Skin: Skin is warm and dry. She is not diaphoretic. No erythema.    ED Course   Procedures (including critical care time)  DIAGNOSTIC STUDIES: Oxygen Saturation is 100% on RA, normal by my interpretation.    COORDINATION OF CARE: 11:48 PM- Discussed negative Strep results, and clinical suspicion that pt has the flu. Pt advised of plan for treatment and pt agrees.  Labs Review Labs Reviewed  RAPID STREP SCREEN  CULTURE, GROUP A STREP   Imaging Review No results found.  EKG Interpretation   None       MDM   1. Influenza-like illness    The patient is well-appearing, strep negative, exam is benign, likely rhinitis, pharyngitis, possibly flu, we'll treat for flulike symptoms, patient is in agreement with the plan.  Meds given in ED:  Medications - No data to display  Discharge Medication List as of 10/01/2013 11:50 PM    START taking these medications   Details  naproxen (NAPROSYN) 500 MG tablet Take 1 tablet (500 mg total) by mouth 2 (two) times daily with a meal., Starting 10/01/2013, Until Discontinued, Print    oseltamivir (TAMIFLU) 75 MG capsule Take 1 capsule (75 mg total) by mouth every 12 (twelve) hours., Starting 10/01/2013, Until Discontinued, Print    promethazine (PHENERGAN) 25 MG tablet Take 1 tablet (25 mg total) by mouth every 6 (six) hours as needed for nausea or vomiting., Starting 10/01/2013, Until Discontinued, Print        I personally performed the services described in this documentation, which was scribed in my presence. The recorded information has been reviewed and is accurate.      Vida Roller, MD 10/02/13 [phone]

## 2013-10-04 LAB — CULTURE, GROUP A STREP

## 2014-11-06 ENCOUNTER — Emergency Department (INDEPENDENT_AMBULATORY_CARE_PROVIDER_SITE_OTHER)
Admission: EM | Admit: 2014-11-06 | Discharge: 2014-11-06 | Disposition: A | Discharge status: Home / Self Care | Payer: Commercial Managed Care - PPO | Source: Home / Self Care | Attending: Family Medicine | Admitting: Family Medicine

## 2014-11-06 ENCOUNTER — Emergency Department (INDEPENDENT_AMBULATORY_CARE_PROVIDER_SITE_OTHER): Payer: Commercial Managed Care - PPO

## 2014-11-06 DIAGNOSIS — M438X2 Other specified deforming dorsopathies, cervical region: Secondary | ICD-10-CM

## 2014-11-06 DIAGNOSIS — M62838 Other muscle spasm: Secondary | ICD-10-CM

## 2014-11-06 DIAGNOSIS — M6248 Contracture of muscle, other site: Secondary | ICD-10-CM | POA: Diagnosis not present

## 2014-11-06 MED ORDER — HYDROCODONE-ACETAMINOPHEN 5-325 MG PO TABS: ORAL_TABLET | ORAL | Status: DC

## 2014-11-06 MED ORDER — METAXALONE 800 MG PO TABS: 800.0000 mg | ORAL_TABLET | Freq: Three times a day (TID) | ORAL | Status: DC

## 2014-11-06 NOTE — ED Provider Notes (Signed)
CSN: [id number]     Arrival date & time 11/06/14  1308 History   First MD Initiated Contact with Patient 11/06/14 1354     Chief Complaint  Patient presents with  . Torticollis      HPI Comments: Patient awoke with left neck pain about 1.5 months ago that has persisted.  She recalls no injury.  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  The pain has not responded to ice application alternating with heat and Ibuprofen.  The pain does not radiate.  The history is provided by the patient.    Past Medical History  Diagnosis Date  . Hypertension    No past surgical history on file. No family history on file. History  Substance Use Topics  . Smoking status: Never Smoker   . Smokeless tobacco: Not on file  . Alcohol Use: No   OB History    No data available     Review of Systems  Constitutional: Negative for fever and chills.  HENT: Negative.   Eyes: Negative.   Respiratory: Negative.   Cardiovascular: Negative.   Gastrointestinal: Negative.   Genitourinary: Negative.   Musculoskeletal: Positive for neck pain and neck stiffness. Negative for back pain.  Skin: Negative.   Neurological: Negative for weakness, numbness and headaches.    Allergies  Review of patient's allergies indicates no known allergies.  Home Medications   Prior to Admission medications   Medication Sig Start Date End Date Taking? Authorizing Provider  hydrochlorothiazide (HYDRODIURIL) 25 MG tablet Take 25 mg by mouth every morning. Has not been taking for past 3 weeks 09/09/12  Yes Elson Areas, PA-C  HYDROcodone-acetaminophen (NORCO/VICODIN) 5-325 MG per tablet Take one by mouth at bedtime as needed for pain 11/06/14   Lattie Haw, MD  metaxalone (SKELAXIN) 800 MG tablet Take 1 tablet (800 mg total) by mouth 3 (three) times daily. 11/06/14   Lattie Haw, MD   BP 125/85 mmHg  Pulse 117  Temp(Src) 98.7 F (37.1 C) (Oral)  Ht  (1.626 m)  Wt 260 lb (117.935  kg)  BMI 44.61 kg/m2  SpO2 99% Physical Exam  Constitutional: She is oriented to person, place, and time. She appears well-developed and well-nourished. No distress.  HENT:  Head: Normocephalic.  Mouth/Throat: Oropharynx is clear and moist.  Eyes: Conjunctivae are normal. Pupils are equal, round, and reactive to light.  Neck: Full passive range of motion without pain. Neck supple. Muscular tenderness present. No spinous process tenderness present. No rigidity. Decreased range of motion present. No thyromegaly present.    Neck has tenderness over trapezius muscles bilaterally  Cardiovascular: Normal heart sounds.   Pulmonary/Chest: Breath sounds normal.  Lymphadenopathy:    She has no cervical adenopathy.  Neurological: She is alert and oriented to person, place, and time.  Skin: Skin is warm and dry. No rash noted.  Nursing note and vitals reviewed.   ED Course  Procedures  none  Imaging Review Dg Cervical Spine Complete  11/06/2014   CLINICAL DATA:  Chronic Mid left neck pain for past 1.5 months, nki  EXAM: CERVICAL SPINE  4+ VIEWS  COMPARISON:  None.  FINDINGS: There is no evidence of cervical spine fracture or prevertebral soft tissue swelling. Straightening of the normal cervical lordosis. No other significant bone abnormalities are identified.  IMPRESSION: 1. Negative for fracture or other acute bone abnormality. 2. Loss of the normal cervical spine lordosis, which may be secondary  to positioning, spasm, or soft tissue injury.   Electronically Signed   By: Corlis Leak  Hassell M.D.   On: 11/06/2014 15:00     MDM   1. Neck muscle spasm    Begin Skelaxin 800mg  TID, and Lortab at bedtime. Apply heating pad to neck 2 to 3 times daily.  Begin range of motion exercises. Followup with Dr. Rodney Langtonhomas [facility]and[facility] ([facility]) if not improving about two weeks.     Lattie HawStephen A Aleighna Wojtas, MD 11/07/14 2141

## 2014-11-06 NOTE — Discharge Instructions (Signed)
Apply heating pad to neck 2 to 3 times daily.  Begin range of motion exercises.   Heat Therapy Heat therapy can help ease sore, stiff, injured, and tight muscles and joints. Heat relaxes your muscles, which may help ease your pain.  RISKS AND COMPLICATIONS If you have any of the following conditions, do not use heat therapy unless your health care provider has approved:  Poor circulation.  Healing wounds or scarred skin in the area being treated.  Diabetes, heart disease, or high blood pressure.  Not being able to feel (numbness) the area being treated.  Unusual swelling of the area being treated.  Active infections.  Blood clots.  Cancer.  Inability to communicate pain. This may include young children and people who have problems with their brain function (dementia).  Pregnancy. Heat therapy should only be used on old, pre-existing, or long-lasting (chronic) injuries. Do not use heat therapy on new injuries unless directed by your health care provider. HOW TO USE HEAT THERAPY There are several different kinds of heat therapy, including:  Moist heat pack.  Warm water bath.  Hot water bottle.  Electric heating pad.  Heated gel pack.  Heated wrap.  Electric heating pad. Use the heat therapy method suggested by your health care provider. Follow your health care provider's instructions on when and how to use heat therapy. GENERAL HEAT THERAPY RECOMMENDATIONS  Do not sleep while using heat therapy. Only use heat therapy while you are awake.  Your skin may turn pink while using heat therapy. Do not use heat therapy if your skin turns red.  Do not use heat therapy if you have new pain.  High heat or long exposure to heat can cause burns. Be careful when using heat therapy to avoid burning your skin.  Do not use heat therapy on areas of your skin that are already irritated, such as with a rash or sunburn. SEEK MEDICAL CARE IF:  You have blisters, redness, swelling,  or numbness.  You have new pain.  Your pain is worse. MAKE SURE YOU:  Understand these instructions.  Will watch your condition.  Will get help right away if you are not doing well or get worse. Document Released: 12/03/2011 Document Revised: 01/25/2014 Document Reviewed: 11/03/2013 [facility] Patient Information 2015 [employer] [place]. This information is not intended to replace advice given to you by your health care provider. Make sure you discuss any questions you have with your health care provider.

## 2014-11-06 NOTE — ED Notes (Signed)
Patient complains of neck pain that started approximately one month ago. Denies any injury. 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 She has tried OTC Ibuprofen that has not helped much with pain. She also notes that yesterday as she was getting her hair done that she did experience some dizziness.

## 2016-01-04 IMAGING — DX DG CERVICAL SPINE COMPLETE 4+V
6 series · 6 of 6 positions shown · non-contrast
Comparison: None.

CLINICAL DATA: Chronic Mid left neck pain for past 1.5 months, nki

EXAM:
CERVICAL SPINE  4+ VIEWS

[c-spine lateral]
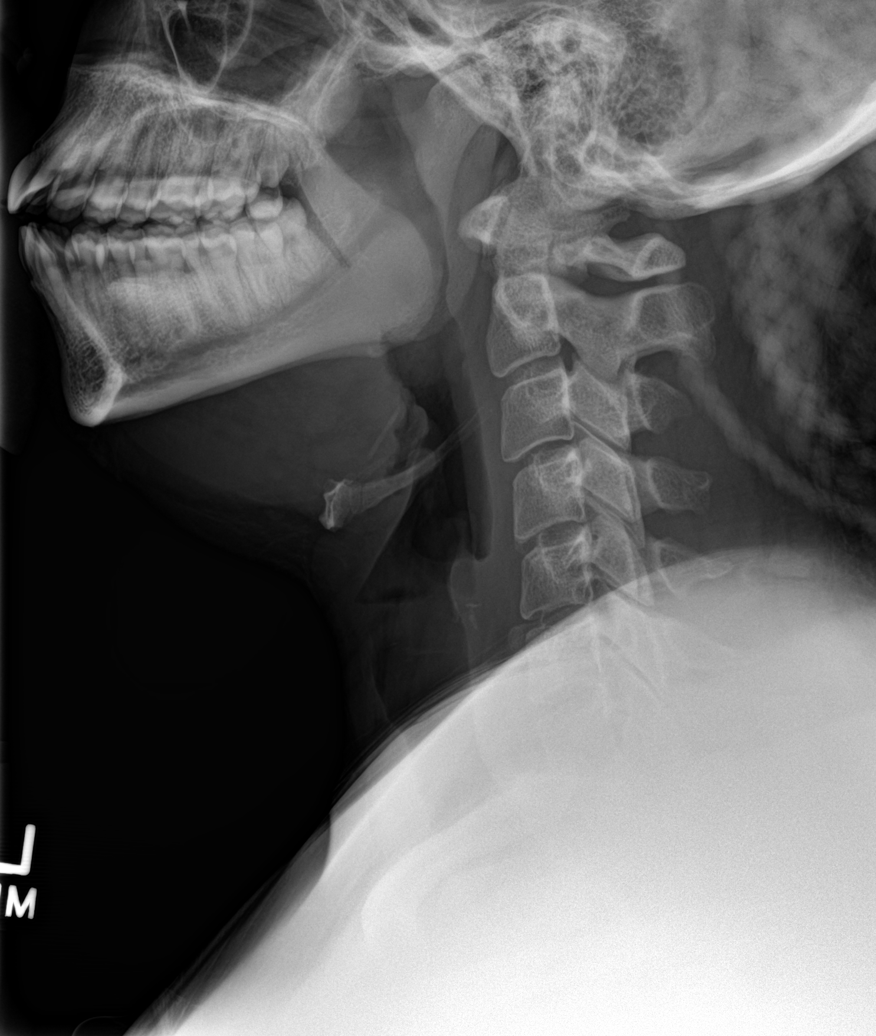

[c-spine obl (1 of 2)]
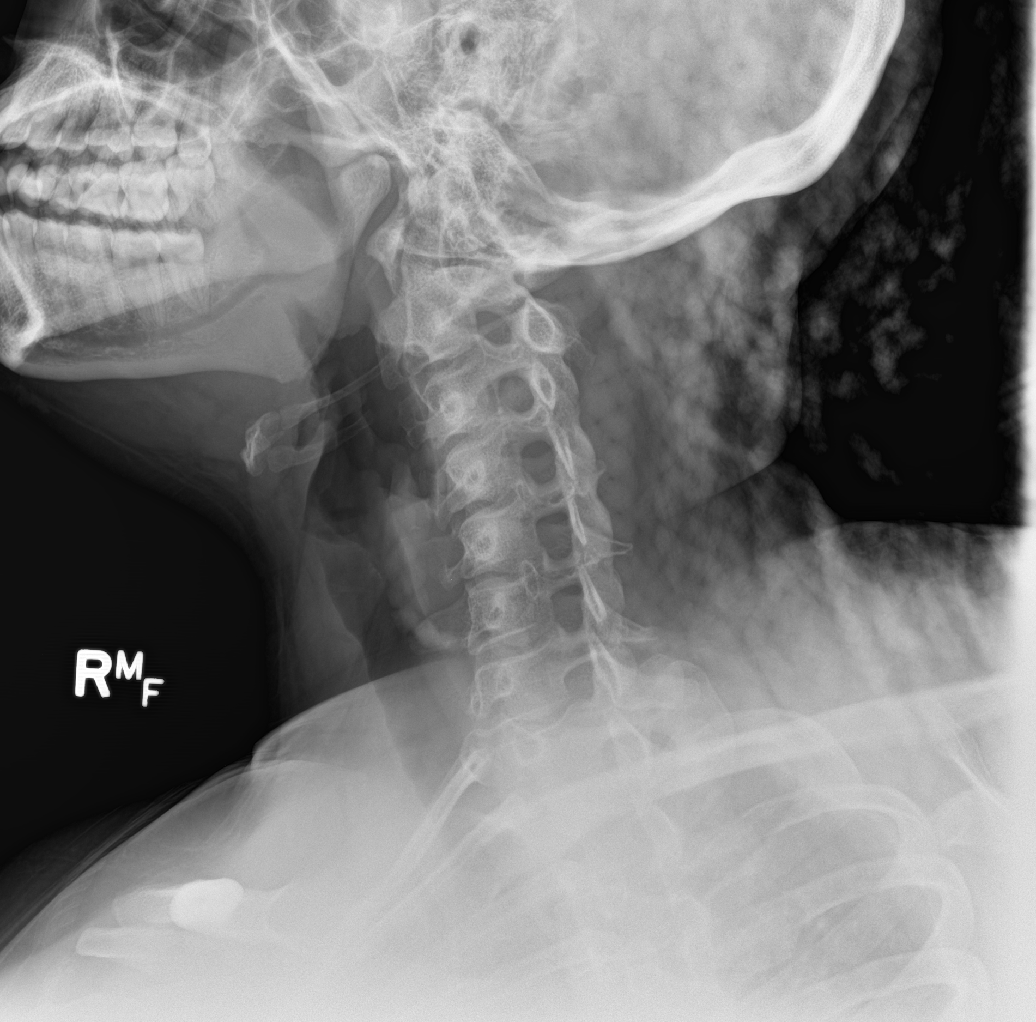

[c-spine obl (2 of 2)]
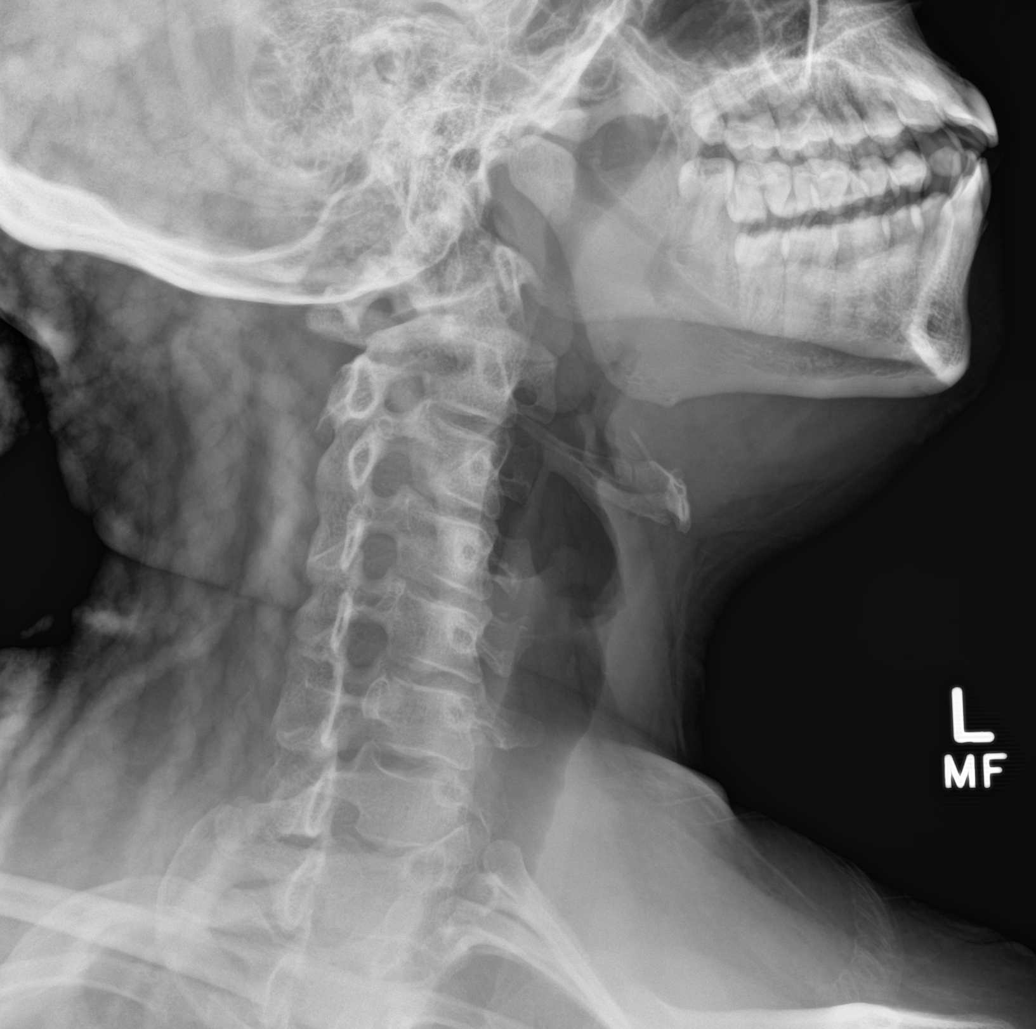

[c-spine ap]
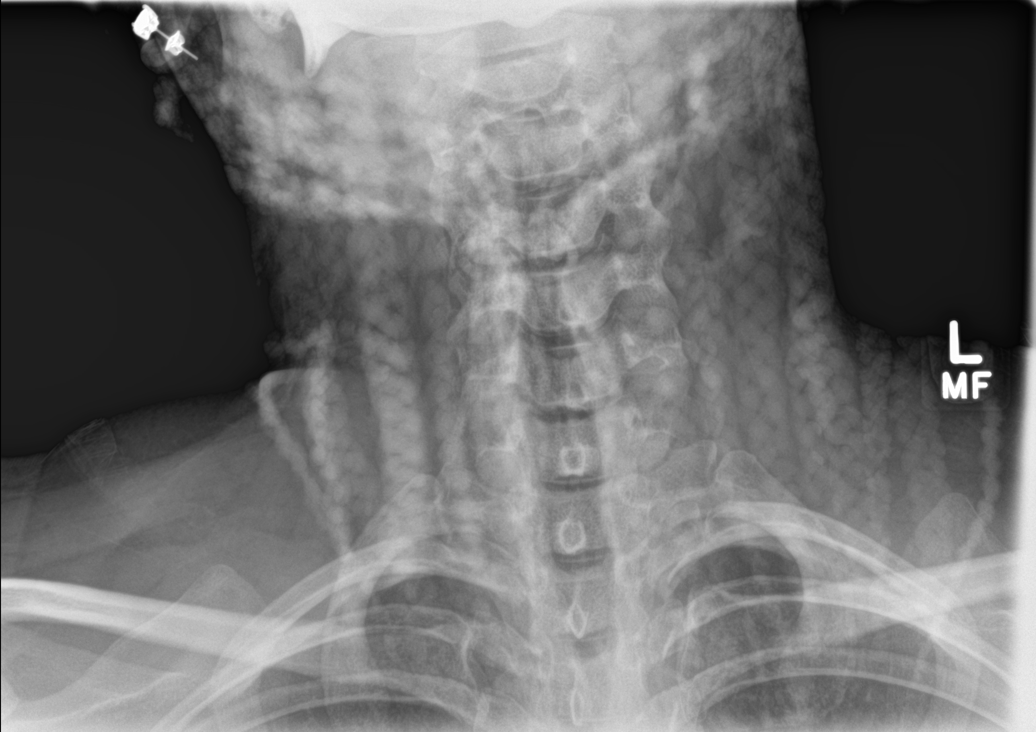

[c-spine open mouth]
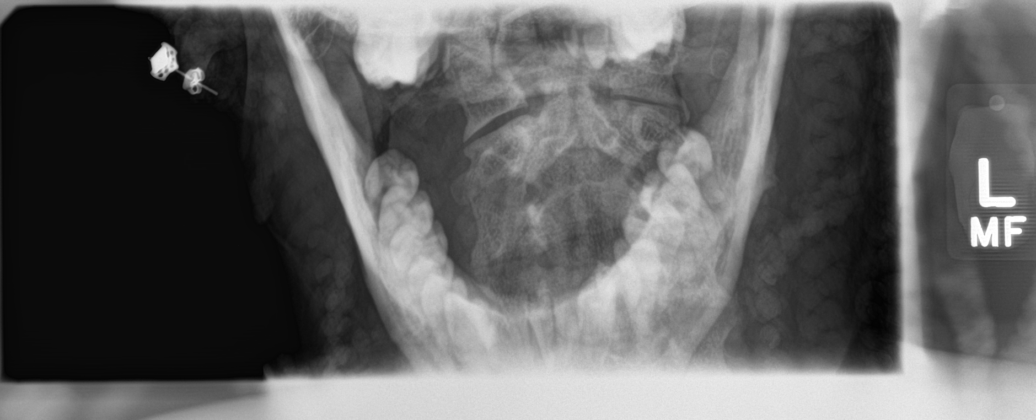

[c-spine swimmers]
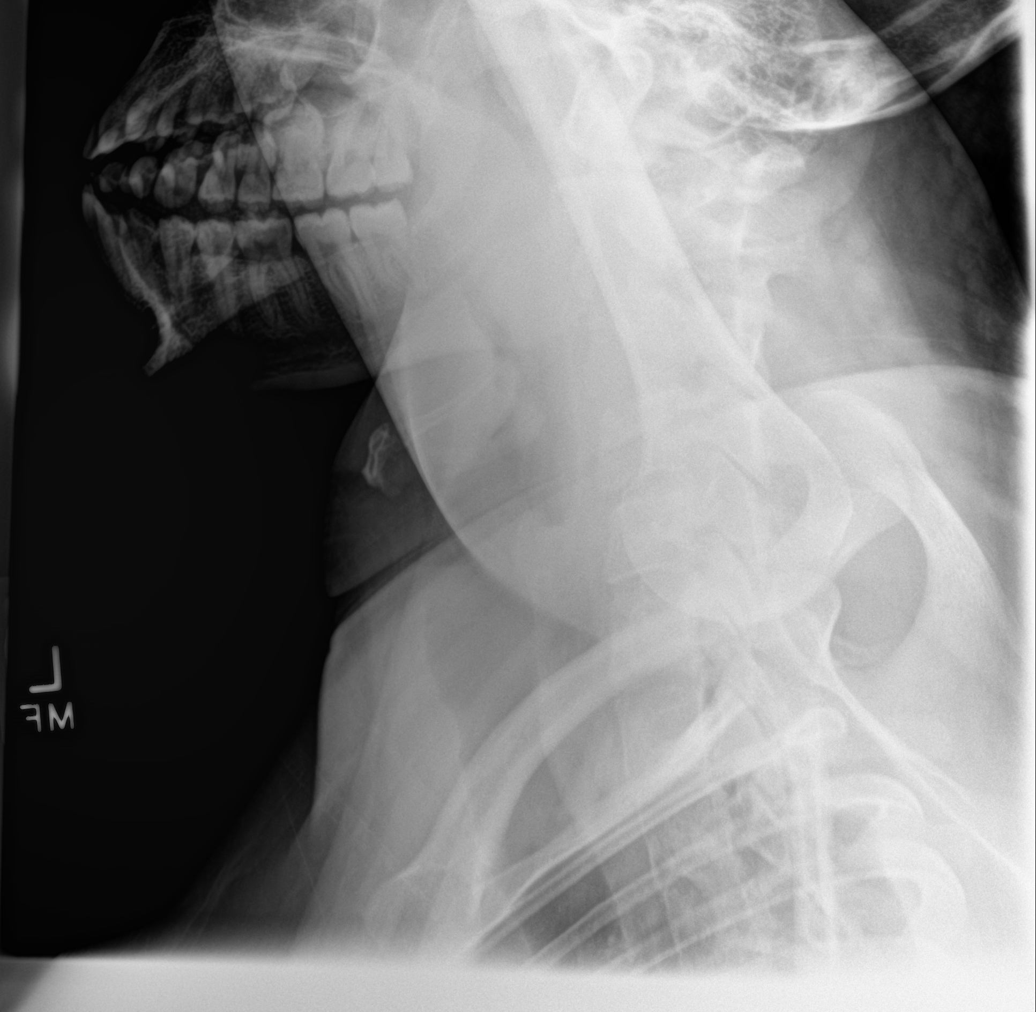

[6 of 6 positions shown; findings below may reference images not displayed]

FINDINGS: There is no evidence of cervical spine fracture or prevertebral soft
tissue swelling. Straightening of the normal cervical lordosis. No
other significant bone abnormalities are identified.
IMPRESSION: 1. Negative for fracture or other acute bone abnormality.
2. Loss of the normal cervical spine lordosis, which may be
secondary to positioning, spasm, or soft tissue injury.

## 2017-01-24 ENCOUNTER — Encounter (HOSPITAL_BASED_OUTPATIENT_CLINIC_OR_DEPARTMENT_OTHER): Payer: Self-pay | Admitting: *Deleted

## 2017-01-24 ENCOUNTER — Emergency Department (HOSPITAL_BASED_OUTPATIENT_CLINIC_OR_DEPARTMENT_OTHER)
Admission: EM | Admit: 2017-01-24 | Discharge: 2017-01-24 | Disposition: A | Discharge status: Home / Self Care | Payer: BLUE CROSS/BLUE SHIELD | Attending: Emergency Medicine | Admitting: Emergency Medicine

## 2017-01-24 DIAGNOSIS — I10 Essential (primary) hypertension: Secondary | ICD-10-CM | POA: Diagnosis not present

## 2017-01-24 DIAGNOSIS — Z79899 Other long term (current) drug therapy: Secondary | ICD-10-CM | POA: Insufficient documentation

## 2017-01-24 DIAGNOSIS — N939 Abnormal uterine and vaginal bleeding, unspecified: Secondary | ICD-10-CM | POA: Diagnosis present

## 2017-01-24 LAB — URINALYSIS, ROUTINE W REFLEX MICROSCOPIC

## 2017-01-24 LAB — WET PREP, GENITAL
CLUE CELLS WET PREP: NONE SEEN
Sperm: NONE SEEN
Trich, Wet Prep: NONE SEEN
Yeast Wet Prep HPF POC: NONE SEEN

## 2017-01-24 LAB — URINALYSIS, MICROSCOPIC (REFLEX)

## 2017-01-24 LAB — PREGNANCY, URINE: PREG TEST UR: NEGATIVE

## 2017-01-24 NOTE — ED Triage Notes (Signed)
States she is having vagina bleeding but does not know if it is her menses. Normally abnormal periods per pt.

## 2017-01-24 NOTE — Discharge Instructions (Addendum)
No signs of lacerations Feel like the bleeding is most likely your menstraul period.  If symptoms persist longer than 5 days with heavy bleeding please follow-up with PCP  Get reevelation sooner if you feel lightheaded, dizzy, or anemic

## 2017-01-24 NOTE — ED Provider Notes (Signed)
[facility] Provider Note   CSN: [id number] Arrival date & time: 01/24/17  1713   History   Chief Complaint Chief Complaint  Patient presents with  . Vaginal Bleeding    HPI Jill Clark is a 26 y.o. female.  HPI  Patient presents to ED with vaginal bleeding. PMH significant for HTN. Patient states that she started bleeding around 4 PM today. States that she has been bleeding heavily. There is associated clotting. She has history of irregular bleeding. Patient is unsure whether this is the start of her menstrual cycle or whether this is from rough intercourse. Last initial period was around this time last month. Patient is on birth control. She has had unprotected intercourse. Is having associated crampy abdominal pain that usually comes with her menstrual cycles.  Past Medical History:  Diagnosis Date  . Hypertension     There are no active problems to display for this patient.  History reviewed. No pertinent surgical history.  OB History    No data available      Home Medications    Prior to Admission medications   Medication Sig Start Date End Date Taking? Authorizing Provider  hydrochlorothiazide (HYDRODIURIL) 25 MG tablet Take 25 mg by mouth every morning. Has not been taking for past 3 weeks 09/09/12   Elson AreasLeslie K Sofia, PA-C  HYDROcodone-acetaminophen (NORCO/VICODIN) 5-325 MG per tablet Take one by mouth at bedtime as needed for pain 11/06/14   Lattie HawStephen A Beese, MD  metaxalone (SKELAXIN) 800 MG tablet Take 1 tablet (800 mg total) by mouth 3 (three) times daily. 11/06/14   Lattie HawStephen A Beese, MD    Family History No family history on file.  Social History Social History  Substance Use Topics  . Smoking status: Never Smoker  . Smokeless tobacco: Never Used  . Alcohol use Yes    Allergies   Patient has no known allergies.  Review of Systems Review of Systems  Constitutional: Negative for chills and fever.  HENT: Negative for congestion.     Respiratory: Negative for shortness of breath.   Cardiovascular: Negative for chest pain.  Gastrointestinal: Positive for abdominal pain. Negative for nausea and vomiting.  Endocrine: Negative for cold intolerance.  Genitourinary: Positive for vaginal bleeding. Negative for dysuria, vaginal discharge and vaginal pain.  Also per HPI  Physical Exam Updated Vital Signs BP (!) 152/105   Pulse (!) 126   Temp 98.6 F (37 C) (Oral)   Resp 20   Ht 5\' 4"  (1.626 m)   Wt 117.9 kg   LMP 12/25/2016   SpO2 100%   BMI 44.63 kg/m   Physical Exam  Constitutional: She appears well-developed and well-nourished. No distress.  HENT:  Head: Normocephalic and atraumatic.  Mouth/Throat: Oropharynx is clear and moist.  Eyes: Conjunctivae and EOM are normal. Pupils are equal, round, and reactive to light.  Neck: Normal range of motion. Neck supple.  Cardiovascular: Normal rate and regular rhythm.   Pulmonary/Chest: Effort normal and breath sounds normal.  Abdominal: Soft. There is no tenderness. There is no guarding.  Genitourinary: Pelvic exam was performed with patient supine. There is bleeding in the vagina. No tenderness in the vagina. No signs of injury around the vagina.  Genitourinary Comments: No visible lacerations.    ED Treatments / Results  Labs (all labs ordered are listed, but only abnormal results are displayed) Labs Reviewed  WET PREP, GENITAL - Abnormal; Notable for the following:       Result Value  WBC, Wet Prep HPF POC MANY (*)    All other components within normal limits  URINALYSIS, ROUTINE W REFLEX MICROSCOPIC - Abnormal; Notable for the following:    Color, Urine RED (*)    APPearance TURBID (*)    Glucose, UA   (*)    Value: TEST NOT REPORTED DUE TO COLOR INTERFERENCE OF URINE PIGMENT   Hgb urine dipstick   (*)    Value: TEST NOT REPORTED DUE TO COLOR INTERFERENCE OF URINE PIGMENT   Bilirubin Urine   (*)    Value: TEST NOT REPORTED DUE TO COLOR INTERFERENCE OF  URINE PIGMENT   Ketones, ur   (*)    Value: TEST NOT REPORTED DUE TO COLOR INTERFERENCE OF URINE PIGMENT   Protein, ur   (*)    Value: TEST NOT REPORTED DUE TO COLOR INTERFERENCE OF URINE PIGMENT   Nitrite   (*)    Value: TEST NOT REPORTED DUE TO COLOR INTERFERENCE OF URINE PIGMENT   Leukocytes, UA   (*)    Value: TEST NOT REPORTED DUE TO COLOR INTERFERENCE OF URINE PIGMENT   All other components within normal limits  URINALYSIS, MICROSCOPIC (REFLEX) - Abnormal; Notable for the following:    Bacteria, UA FEW (*)    Squamous Epithelial / LPF 6-30 (*)    All other components within normal limits  PREGNANCY, URINE  GC/CHLAMYDIA PROBE AMP ([place]) NOT AT [facility]    EKG  EKG Interpretation  Date/Time:  Thursday Jan 24 2017 17:38:07 EDT Ventricular Rate:  97 PR Interval:  176 QRS Duration: 80 QT Interval:  338 QTC Calculation: 429 R Axis:   70 Text Interpretation:  Normal sinus rhythm Normal ECG When compared to prior, no significant chcanges seen.  No STEMI Confirmed by [facility] MD, CHRISTOPHER [phone]) on 01/24/2017 5:59:25 PM       Radiology No results found.  Procedures Procedures (including critical care time)  Medications Ordered in ED Medications - No data to display   Initial Impression / Assessment and Plan / ED Course  I have reviewed the triage vital signs and the nursing notes.  Pertinent labs & imaging results that were available during my care of the patient were reviewed by me and considered in my medical decision making (see chart for details).  Patient presented to ED with vaginal bleeding. History and pelvic exam was consistent with normal menstrual bleeding. There were no lacerations seen on pelvic exam. Upreg and UA negative. Collected STD testing per patient request. Wet prep negative. Discussed return precautions. Reassurance given. Follow-up with outpatient provider.   Final Clinical Impressions(s) / ED Diagnoses   Final diagnoses:  Vaginal bleeding     New Prescriptions New Prescriptions   No medications on file     Pincus Large, DO 01/24/17 2322    Canary Brim Tegeler, MD 01/25/17 1550

## 2017-01-24 NOTE — ED Notes (Signed)
Pt verbalizes understanding of d/c instructions and denies any further needs at this time. 

## 2017-01-25 LAB — GC/CHLAMYDIA PROBE AMP (~~LOC~~) NOT AT ARMC
Chlamydia: NEGATIVE
Neisseria Gonorrhea: NEGATIVE

## 2017-08-10 ENCOUNTER — Emergency Department (HOSPITAL_BASED_OUTPATIENT_CLINIC_OR_DEPARTMENT_OTHER)
Admission: EM | Admit: 2017-08-10 | Discharge: 2017-08-10 | Disposition: A | Discharge status: Home / Self Care | Payer: BLUE CROSS/BLUE SHIELD | Attending: Emergency Medicine | Admitting: Emergency Medicine

## 2017-08-10 ENCOUNTER — Encounter (HOSPITAL_BASED_OUTPATIENT_CLINIC_OR_DEPARTMENT_OTHER): Payer: Self-pay | Admitting: Emergency Medicine

## 2017-08-10 ENCOUNTER — Other Ambulatory Visit: Payer: Self-pay

## 2017-08-10 DIAGNOSIS — Y9389 Activity, other specified: Secondary | ICD-10-CM | POA: Insufficient documentation

## 2017-08-10 DIAGNOSIS — Y990 Civilian activity done for income or pay: Secondary | ICD-10-CM | POA: Diagnosis not present

## 2017-08-10 DIAGNOSIS — Z79899 Other long term (current) drug therapy: Secondary | ICD-10-CM | POA: Diagnosis not present

## 2017-08-10 DIAGNOSIS — S39012A Strain of muscle, fascia and tendon of lower back, initial encounter: Secondary | ICD-10-CM

## 2017-08-10 DIAGNOSIS — X509XXA Other and unspecified overexertion or strenuous movements or postures, initial encounter: Secondary | ICD-10-CM | POA: Diagnosis not present

## 2017-08-10 DIAGNOSIS — S3992XA Unspecified injury of lower back, initial encounter: Secondary | ICD-10-CM | POA: Diagnosis present

## 2017-08-10 DIAGNOSIS — Y9289 Other specified places as the place of occurrence of the external cause: Secondary | ICD-10-CM | POA: Insufficient documentation

## 2017-08-10 DIAGNOSIS — I10 Essential (primary) hypertension: Secondary | ICD-10-CM | POA: Diagnosis not present

## 2017-08-10 MED ORDER — CYCLOBENZAPRINE HCL 10 MG PO TABS: 10.0000 mg | ORAL_TABLET | Freq: Every day | ORAL | 0 refills | Status: DC

## 2017-08-10 MED ORDER — TRAMADOL HCL 50 MG PO TABS: 50.0000 mg | ORAL_TABLET | Freq: Four times a day (QID) | ORAL | 0 refills | Status: DC | PRN

## 2017-08-10 MED ORDER — IBUPROFEN 800 MG PO TABS: 800.0000 mg | ORAL_TABLET | Freq: Three times a day (TID) | ORAL | 0 refills | Status: DC | PRN

## 2017-08-10 NOTE — ED Provider Notes (Signed)
[facility] EMERGENCY DEPARTMENT Provider Note   CSN: [id number] Arrival date & time: 08/10/17  1039     History   Chief Complaint Chief Complaint  Patient presents with  . Back Pain    HPI Jill Clark is a 26 y.o. female.  HPI Patient presents to the emergency department with back pain after lifting heavy boxes at work last night.  The patient states that she does lifting at work normally but these boxes were heavier than she is used to handling.  She states that she felt fine while at work but when she went home and laid down her back started to tighten up and became more painful.  She is complaining of left lateral back pain.  She states the pain does not seem to radiate.  She has no numbness or weakness in her lower extremities.  Patient states she did not take any medications prior to arrival.  She states movement and palpation make the lower back pain worse.  Patient states nothing seems to make the pain better.  The patient denies chest pain, shortness of breath, numbness, weakness, incontinence, headache, blurred vision, nausea, vomiting, or syncope. Past Medical History:  Diagnosis Date  . Hypertension     There are no active problems to display for this patient.   History reviewed. No pertinent surgical history.  OB History    No data available       Home Medications    Prior to Admission medications   Medication Sig Start Date End Date Taking? Authorizing Provider  metoprolol tartrate (LOPRESSOR) 50 MG tablet Take 50 mg 1 day or 1 dose by mouth.   Yes [provider]  hydrochlorothiazide (HYDRODIURIL) 25 MG tablet Take 25 mg by mouth every morning. Has not been taking for past 3 weeks 09/09/12   Elson AreasSofia, Leslie K, PA-C  HYDROcodone-acetaminophen (NORCO/VICODIN) 5-325 MG per tablet Take one by mouth at bedtime as needed for pain 11/06/14   Lattie HawBeese, Stephen A, MD  metaxalone (SKELAXIN) 800 MG tablet Take 1 tablet (800 mg total) by mouth 3 (three)  times daily. 11/06/14   Lattie HawBeese, Stephen A, MD    Family History No family history on file.  Social History Social History   Tobacco Use  . Smoking status: Never Smoker  . Smokeless tobacco: Never Used  Substance Use Topics  . Alcohol use: Yes    Comment: social  . Drug use: No     Allergies   Patient has no known allergies.   Review of Systems Review of Systems All other systems negative except as documented in the HPI. All pertinent positives and negatives as reviewed in the HPI.  Physical Exam Updated Vital Signs BP (!) 137/97 (BP Location: Left Arm)   Pulse 94   Temp 98.7 F (37.1 C) (Oral)   Resp 18   Ht 5\' 4"  (1.626 m)   Wt 117.9 kg (260 lb)   LMP 07/13/2017   SpO2 100%   BMI 44.63 kg/m   Physical Exam  Constitutional: She is oriented to person, place, and time. She appears well-developed and well-nourished. No distress.  Eyes: Pupils are equal, round, and reactive to light.  Cardiovascular: Normal rate, regular rhythm and normal heart sounds. Exam reveals no gallop and no friction rub.  No murmur heard. Pulmonary/Chest: Effort normal and breath sounds normal. No respiratory distress. She has no wheezes.  Abdominal: She exhibits no distension. There is no tenderness.  Neurological: She is alert and oriented to person,  place, and time. She has normal strength. She displays normal reflexes. No sensory deficit. She exhibits normal muscle tone. Coordination and gait normal. GCS eye subscore is 4. GCS verbal subscore is 5. GCS motor subscore is 6.  Skin: No rash noted.  Nursing note and vitals reviewed.    ED Treatments / Results  Labs (all labs ordered are listed, but only abnormal results are displayed) Labs Reviewed - No data to display  EKG  EKG Interpretation None       Radiology No results found.  Procedures Procedures (including critical care time)  Medications Ordered in ED Medications - No data to display   Initial Impression /  Assessment and Plan / ED Course  I have reviewed the triage vital signs and the nursing notes.  Pertinent labs & imaging results that were available during my care of the patient were reviewed by me and considered in my medical decision making (see chart for details).     Patient has no neurological deficits noted on exam she has negative straight leg raise.  She is also got no sensory deficits or strength abnormalities in her lower extremities.  The patient did ambulate without difficulty.  So we will treat the patient as she has a lumbar strain based on the mechanism and physical exam findings.  Patient is advised to use ice and heat on her lower back told return here as needed.  Also advised the patient to follow-up with her primary doctor.  Patient agrees to the plan and all questions were answered Final Clinical Impressions(s) / ED Diagnoses   Final diagnoses:  None    ED Discharge Orders    None       Charlestine NightLawyer, Brighten Buzzelli, PA-C 08/10/17 1128    Jacalyn LefevreHaviland, Julie, MD 08/10/17 [phone]

## 2017-08-10 NOTE — ED Notes (Signed)
ED Provider at bedside. 

## 2017-08-10 NOTE — ED Notes (Signed)
Assumed care of patient from [place], [place]. Pt resting quietly. No distress. Awaiting EDP disposition.

## 2017-08-10 NOTE — ED Triage Notes (Addendum)
"   The last 2 hrs at work last night, the boxes started getting real heavy and when I got home I could not get up because my lower back was hurting" No prior back injury or issues

## 2017-08-10 NOTE — Discharge Instructions (Signed)
Return here as needed. follow-up with your primary doctor use ice and heat on your lower back.

## 2020-07-12 ENCOUNTER — Encounter (HOSPITAL_BASED_OUTPATIENT_CLINIC_OR_DEPARTMENT_OTHER): Payer: Self-pay | Admitting: Emergency Medicine

## 2020-07-12 ENCOUNTER — Emergency Department (HOSPITAL_BASED_OUTPATIENT_CLINIC_OR_DEPARTMENT_OTHER)
Admission: EM | Admit: 2020-07-12 | Discharge: 2020-07-12 | Disposition: A | Discharge status: Home / Self Care | Payer: BC Managed Care – PPO | Attending: Emergency Medicine | Admitting: Emergency Medicine

## 2020-07-12 ENCOUNTER — Other Ambulatory Visit: Payer: Self-pay

## 2020-07-12 ENCOUNTER — Emergency Department (HOSPITAL_BASED_OUTPATIENT_CLINIC_OR_DEPARTMENT_OTHER): Payer: BC Managed Care – PPO

## 2020-07-12 DIAGNOSIS — O10011 Pre-existing essential hypertension complicating pregnancy, first trimester: Secondary | ICD-10-CM | POA: Insufficient documentation

## 2020-07-12 DIAGNOSIS — Z79899 Other long term (current) drug therapy: Secondary | ICD-10-CM | POA: Diagnosis not present

## 2020-07-12 DIAGNOSIS — R1030 Lower abdominal pain, unspecified: Secondary | ICD-10-CM | POA: Insufficient documentation

## 2020-07-12 DIAGNOSIS — O26891 Other specified pregnancy related conditions, first trimester: Secondary | ICD-10-CM | POA: Diagnosis not present

## 2020-07-12 DIAGNOSIS — Z3A01 Less than 8 weeks gestation of pregnancy: Secondary | ICD-10-CM | POA: Diagnosis not present

## 2020-07-12 DIAGNOSIS — O209 Hemorrhage in early pregnancy, unspecified: Secondary | ICD-10-CM | POA: Insufficient documentation

## 2020-07-12 HISTORY — DX: Obesity, unspecified: E66.9

## 2020-07-12 LAB — CBC WITH DIFFERENTIAL/PLATELET
Abs Immature Granulocytes: 0.02 10*3/uL (ref 0.00–0.07)
Basophils Absolute: 0 10*3/uL (ref 0.0–0.1)
Basophils Relative: 0 %
Eosinophils Absolute: 0 10*3/uL (ref 0.0–0.5)
Eosinophils Relative: 1 %
HCT: 38.8 % (ref 36.0–46.0)
Hemoglobin: 13 g/dL (ref 12.0–15.0)
Immature Granulocytes: 0 %
Lymphocytes Relative: 39 %
Lymphs Abs: 2.5 10*3/uL (ref 0.7–4.0)
MCH: 30.7 pg (ref 26.0–34.0)
MCHC: 33.5 g/dL (ref 30.0–36.0)
MCV: 91.7 fL (ref 80.0–100.0)
Monocytes Absolute: 0.5 10*3/uL (ref 0.1–1.0)
Monocytes Relative: 8 %
Neutro Abs: 3.3 10*3/uL (ref 1.7–7.7)
Neutrophils Relative %: 52 %
Platelets: 239 10*3/uL (ref 150–400)
RBC: 4.23 MIL/uL (ref 3.87–5.11)
RDW: 13 % (ref 11.5–15.5)
WBC: 6.4 10*3/uL (ref 4.0–10.5)
nRBC: 0 % (ref 0.0–0.2)

## 2020-07-12 LAB — URINALYSIS, ROUTINE W REFLEX MICROSCOPIC
Bilirubin Urine: NEGATIVE
Glucose, UA: NEGATIVE mg/dL
Ketones, ur: NEGATIVE mg/dL
Leukocytes,Ua: NEGATIVE
Nitrite: NEGATIVE
Protein, ur: NEGATIVE mg/dL
Specific Gravity, Urine: 1.025 (ref 1.005–1.030)
pH: 6 (ref 5.0–8.0)

## 2020-07-12 LAB — COMPREHENSIVE METABOLIC PANEL
ALT: 17 U/L (ref 0–44)
AST: 17 U/L (ref 15–41)
Albumin: 3.8 g/dL (ref 3.5–5.0)
Alkaline Phosphatase: 30 U/L — ABNORMAL LOW (ref 38–126)
Anion gap: 8 (ref 5–15)
BUN: 6 mg/dL (ref 6–20)
CO2: 26 mmol/L (ref 22–32)
Calcium: 8.7 mg/dL — ABNORMAL LOW (ref 8.9–10.3)
Chloride: 101 mmol/L (ref 98–111)
Creatinine, Ser: 0.88 mg/dL (ref 0.44–1.00)
GFR, Estimated: >60 mL/min (ref >60)
Glucose, Bld: 91 mg/dL (ref 70–99)
Potassium: 3.8 mmol/L (ref 3.5–5.1)
Sodium: 135 mmol/L (ref 135–145)
Total Bilirubin: 0.4 mg/dL (ref 0.3–1.2)
Total Protein: 7.5 g/dL (ref 6.5–8.1)

## 2020-07-12 LAB — URINALYSIS, MICROSCOPIC (REFLEX): WBC, UA: NONE SEEN WBC/hpf (ref 0–5)

## 2020-07-12 LAB — HCG, QUANTITATIVE, PREGNANCY: hCG, Beta Chain, Quant, S: 44667 m[IU]/mL — ABNORMAL HIGH (ref <5)

## 2020-07-12 NOTE — ED Provider Notes (Signed)
[facility] EMERGENCY DEPARTMENT Provider Note   CSN: [id number] Arrival date & time: 07/12/20  1736     History Chief Complaint  Patient presents with  . Vaginal bleeding, preg x7 wks    Jill Clark is a 29 y.o. female, G1 P0, presenting to the emergency department with complaint of light vaginal bleeding over the course of the last 4 days.  She states the bleeding is equivocal to spotting, she is also been having some lower abdominal cramping.  She has established care with OB specialist at a [facility] in [place].  She had an OB visit 1 week ago where she had pelvic exam and confirmed pregnancy by hCG levels.  She has not had formal ultrasound yet.  She called her OB specialist who provided reassurance, however patient decided to come to the ED due to cramping pain in her lower abdomen which she felt to be abnormal.  LMP sometime at the end of August or early September 2021.  She states she is estimated to be about [redacted] weeks pregnant.  The history is provided by the patient.       Past Medical History:  Diagnosis Date  . Hypertension   . Obesity     There are no problems to display for this patient.   History reviewed. No pertinent surgical history.   OB History    Gravida  1   Para      Term      Preterm      AB      Living        SAB      TAB      Ectopic      Multiple      Live Births              No family history on file.  Social History   Tobacco Use  . Smoking status: Never Smoker  . Smokeless tobacco: Never Used  Substance Use Topics  . Alcohol use: Yes    Comment: social  . Drug use: No    Home Medications Prior to Admission medications   Medication Sig Start Date End Date Taking? Authorizing Provider  cyclobenzaprine (FLEXERIL) 10 MG tablet Take 1 tablet (10 mg total) at bedtime by mouth. 08/10/17   Lawyer, Cristal Deer, PA-C  hydrochlorothiazide (HYDRODIURIL) 25 MG tablet Take 25 mg by mouth every morning.  Has not been taking for past 3 weeks 09/09/12   Elson Areas, PA-C  HYDROcodone-acetaminophen (NORCO/VICODIN) 5-325 MG per tablet Take one by mouth at bedtime as needed for pain 11/06/14   Lattie Haw, MD  ibuprofen (ADVIL,MOTRIN) 800 MG tablet Take 1 tablet (800 mg total) every 8 (eight) hours as needed by mouth. 08/10/17   Lawyer, Cristal Deer, PA-C  metaxalone (SKELAXIN) 800 MG tablet Take 1 tablet (800 mg total) by mouth 3 (three) times daily. 11/06/14   Lattie Haw, MD  metoprolol tartrate (LOPRESSOR) 50 MG tablet Take 50 mg 1 day or 1 dose by mouth.    [provider]  traMADol (ULTRAM) 50 MG tablet Take 1 tablet (50 mg total) every 6 (six) hours as needed by mouth for severe pain. 08/10/17   Charlestine Night, PA-C    Allergies    Patient has no known allergies.  Review of Systems   Review of Systems  Gastrointestinal: Positive for abdominal pain.  Genitourinary: Positive for vaginal bleeding.  All other systems reviewed and are negative.   Physical Exam  Updated Vital Signs BP 127/90   Pulse 81   Temp 98.6 F (37 C) (Oral)   Resp 16   Ht 5' 3.5" (1.613 m)   Wt 116.1 kg   LMP 05/25/2020   SpO2 100%   BMI 44.62 kg/m   Physical Exam Vitals and nursing note reviewed.  Constitutional:      General: She is not in acute distress.    Appearance: She is well-developed. She is not ill-appearing.  HENT:     Head: Normocephalic and atraumatic.  Eyes:     Conjunctiva/sclera: Conjunctivae normal.  Cardiovascular:     Rate and Rhythm: Normal rate and regular rhythm.  Pulmonary:     Effort: Pulmonary effort is normal. No respiratory distress.     Breath sounds: Normal breath sounds.  Abdominal:     General: Bowel sounds are normal.     Palpations: Abdomen is soft.     Tenderness: There is no abdominal tenderness. There is no guarding or rebound.  Skin:    General: Skin is warm.  Neurological:     Mental Status: She is alert.  Psychiatric:         Behavior: Behavior normal.     ED Results / Procedures / Treatments   Labs (all labs ordered are listed, but only abnormal results are displayed) Labs Reviewed  COMPREHENSIVE METABOLIC PANEL - Abnormal; Notable for the following components:      Result Value   Calcium 8.7 (*)    Alkaline Phosphatase 30 (*)    All other components within normal limits  URINALYSIS, ROUTINE W REFLEX MICROSCOPIC - Abnormal; Notable for the following components:   APPearance CLOUDY (*)    Hgb urine dipstick LARGE (*)    All other components within normal limits  HCG, QUANTITATIVE, PREGNANCY - Abnormal; Notable for the following components:   hCG, Beta Chain, Quant, S 48,185 (*)    All other components within normal limits  URINALYSIS, MICROSCOPIC (REFLEX) - Abnormal; Notable for the following components:   Bacteria, UA RARE (*)    All other components within normal limits  CBC WITH DIFFERENTIAL/PLATELET  ABO/RH    EKG None  Radiology US OB Transvaginal  Result Date: 07/12/2020 CLINICAL DATA:  Pelvic pain and vaginal bleeding. Six weeks 6 days gestational age by LMP. EXAM: OBSTETRIC <14 WK [place] AND TRANSVAGINAL OB US TECHNIQUE: Both transabdominal and transvaginal ultrasound examinations were performed for complete evaluation of the gestation as well as the maternal uterus, adnexal regions, and pelvic cul-de-sac. Transvaginal technique was performed to assess early pregnancy. COMPARISON:  None. FINDINGS: Intrauterine gestational sac: Single Yolk sac:  Present Embryo:  Present Cardiac Activity: Present Heart Rate: 127 bpm CRL:  5 mm   6 w   1 d                  [place] EDC: 03/06/2021 Subchorionic hemorrhage:  None visualized. Maternal uterus/adnexae: Myometrial parenchyma appears normal. Both ovaries appear normal in size without evidence of mass. No free fluid. IMPRESSION: Normal appearing single living intrauterine pregnancy at 6 weeks 1 day by crown-rump length. No sign of subchorionic hemorrhage or other  complicating feature. Electronically Signed   By: Paulina Fusi M.D.   On: 07/12/2020 19:37    Procedures Procedures (including critical care time)  Medications Ordered in ED Medications - No data to display  ED Course  I have reviewed the triage vital signs and the nursing notes.  Pertinent labs & imaging results that were available during  my care of the patient were reviewed by me and considered in my medical decision making (see chart for details).    MDM Rules/Calculators/A&P                          Patient is G1P0 in early first pregnancy, presenting with spotting and lower abdominal cramping over the last 4 days.  She has established OB care and had office visit 1 week ago where she had pelvic exam and firmed pregnancy and hCG levels.  She has not yet had a formal ultrasound.  She called her OB regarding her symptoms today and was provided with reassurance, however the cramping pain in her abdomen persisted therefore she came to the ED for evaluation.  On exam, she is in no distress, abdomen is soft and nontender.  Vital signs are stable.  Labs obtained in triage are reassuring with stable hemoglobin of 13, quantitative hCG is appropriately elevated at 45,000.  Patient is Rh+.  Pelvic ultrasound obtained reveals live intrauterine pregnancy estimated about 6 weeks 1 day gestation.  No other complications noted.  Discussed pelvic exam, however as patient has recently had testing done 1 week ago and is not having significant bleeding, will defer.  She is instructed to follow close with her OB specialist, as well as instructed of strict return precautions to the MAU as needed for worsening bleeding or pain.  Patient verbalized understanding agrees with care plan.  Final Clinical Impression(s) / ED Diagnoses Final diagnoses:  Less than [redacted] weeks gestation of pregnancy    Rx / DC Orders ED Discharge Orders    None       Kacee Sukhu, [place] N, PA-C 07/12/20 2304    Arby Barrette,  MD 07/13/20 2146

## 2020-07-12 NOTE — ED Notes (Addendum)
Pt. Started having slight vaginal bleeding 4 days ago, with mild cramping. No problems urinating. 0/10 stated

## 2020-07-12 NOTE — ED Triage Notes (Signed)
Vaginal bleeding and low abdominal cramping for a few days.  Pt is [redacted] wks pregnant.  First pregnancy.

## 2020-07-12 NOTE — Discharge Instructions (Addendum)
Please follow closely with your OB provider regarding your visit today. It is important you follow-up if you continue to have bleeding.  You can report to the maternity assessment unit at [facility] if you have worsening bleeding or pain for more urgent evaluation if your OB provider is unable to see you in the office same day.

## 2020-07-13 LAB — ABO/RH: ABO/RH(D): A POS

## 2021-08-18 ENCOUNTER — Encounter (HOSPITAL_BASED_OUTPATIENT_CLINIC_OR_DEPARTMENT_OTHER): Payer: Self-pay

## 2021-08-18 ENCOUNTER — Other Ambulatory Visit: Payer: Self-pay

## 2021-08-18 ENCOUNTER — Emergency Department (HOSPITAL_BASED_OUTPATIENT_CLINIC_OR_DEPARTMENT_OTHER)
Admission: EM | Admit: 2021-08-18 | Discharge: 2021-08-18 | Disposition: A | Discharge status: Home / Self Care | Payer: BC Managed Care – PPO | Attending: Emergency Medicine | Admitting: Emergency Medicine

## 2021-08-18 DIAGNOSIS — I10 Essential (primary) hypertension: Secondary | ICD-10-CM | POA: Diagnosis not present

## 2021-08-18 DIAGNOSIS — R002 Palpitations: Secondary | ICD-10-CM | POA: Insufficient documentation

## 2021-08-18 DIAGNOSIS — Z79899 Other long term (current) drug therapy: Secondary | ICD-10-CM | POA: Diagnosis not present

## 2021-08-18 LAB — CBC
HCT: 39.9 % (ref 36.0–46.0)
Hemoglobin: 13.1 g/dL (ref 12.0–15.0)
MCH: 29.7 pg (ref 26.0–34.0)
MCHC: 32.8 g/dL (ref 30.0–36.0)
MCV: 90.5 fL (ref 80.0–100.0)
Platelets: 225 10*3/uL (ref 150–400)
RBC: 4.41 MIL/uL (ref 3.87–5.11)
RDW: 12.6 % (ref 11.5–15.5)
WBC: 6.3 10*3/uL (ref 4.0–10.5)
nRBC: 0 % (ref 0.0–0.2)

## 2021-08-18 LAB — TROPONIN I (HIGH SENSITIVITY): Troponin I (High Sensitivity): 3 ng/L (ref <18)

## 2021-08-18 LAB — BASIC METABOLIC PANEL
Anion gap: 5 (ref 5–15)
BUN: 8 mg/dL (ref 6–20)
CO2: 26 mmol/L (ref 22–32)
Calcium: 9 mg/dL (ref 8.9–10.3)
Chloride: 103 mmol/L (ref 98–111)
Creatinine, Ser: 0.97 mg/dL (ref 0.44–1.00)
GFR, Estimated: >60 mL/min (ref >60)
Glucose, Bld: 110 mg/dL — ABNORMAL HIGH (ref 70–99)
Potassium: 3.7 mmol/L (ref 3.5–5.1)
Sodium: 134 mmol/L — ABNORMAL LOW (ref 135–145)

## 2021-08-18 LAB — PREGNANCY, URINE: Preg Test, Ur: NEGATIVE

## 2021-08-18 NOTE — ED Notes (Signed)
Pt d/c home per MD order. Discharge summary reviewed with pt, pt verbalizes understanding. Ambulatory off unit. No s/s of acute distress noted. D/C home with visitor  

## 2021-08-18 NOTE — ED Provider Notes (Signed)
[facility] Provider Note   CSN: [id number] Arrival date & time: 08/18/21  1915     History Chief Complaint  Patient presents with   Palpitations    Jill Clark is a 30 y.o. female.  Patient is a 30 year old female with a history of hypertension, GERD and obesity who presents with palpitations.  She says for the last 3 days she has been feeling jittery and like her heart's been beating fast.  She says it is pretty much all day.  She cannot sleep very well and at times she feels nauseated.  She denies any associated chest pain.  No shortness of breath.  She feels fatigued.  No recent illnesses.  No cough or cold symptoms.  No abdominal pain.  No vomiting or diarrhea.  No urinary symptoms.  No history of prior heart problems.  She denies any excessive caffeine use.  She denies any ingestion of over-the-counter medicines or supplements.  She does take metoprolol for hypertension and she says she does not take it consistently.  She denies any known history of thyroid problems.      Past Medical History:  Diagnosis Date   Hypertension    Obesity     There are no problems to display for this patient.   History reviewed. No pertinent surgical history.   OB History     Gravida  1   Para      Term      Preterm      AB      Living         SAB      IAB      Ectopic      Multiple      Live Births              History reviewed. No pertinent family history.  Social History   Tobacco Use   Smoking status: Never   Smokeless tobacco: Never  Substance Use Topics   Alcohol use: Yes    Comment: social   Drug use: No    Home Medications Prior to Admission medications   Medication Sig Start Date End Date Taking? Authorizing Provider  cyclobenzaprine (FLEXERIL) 10 MG tablet Take 1 tablet (10 mg total) at bedtime by mouth. 08/10/17   Lawyer, Harrell Gave, PA-C  hydrochlorothiazide (HYDRODIURIL) 25 MG tablet Take 25 mg by mouth  every morning. Has not been taking for past 3 weeks 09/09/12   Fransico Meadow, PA-C  HYDROcodone-acetaminophen (NORCO/VICODIN) 5-325 MG per tablet Take one by mouth at bedtime as needed for pain 11/06/14   Kandra Nicolas, MD  ibuprofen (ADVIL,MOTRIN) 800 MG tablet Take 1 tablet (800 mg total) every 8 (eight) hours as needed by mouth. 08/10/17   Lawyer, Harrell Gave, PA-C  metaxalone (SKELAXIN) 800 MG tablet Take 1 tablet (800 mg total) by mouth 3 (three) times daily. 11/06/14   Kandra Nicolas, MD  metoprolol tartrate (LOPRESSOR) 50 MG tablet Take 50 mg 1 day or 1 dose by mouth.    [provider]  traMADol (ULTRAM) 50 MG tablet Take 1 tablet (50 mg total) every 6 (six) hours as needed by mouth for severe pain. 08/10/17   Dalia Heading, PA-C    Allergies    Patient has no known allergies.  Review of Systems   Review of Systems  Constitutional:  Positive for fatigue. Negative for chills, diaphoresis and fever.  HENT:  Negative for congestion, rhinorrhea and sneezing.   Eyes: Negative.  Respiratory:  Negative for cough, chest tightness and shortness of breath.   Cardiovascular:  Positive for palpitations. Negative for chest pain and leg swelling.  Gastrointestinal:  Positive for nausea. Negative for abdominal pain, blood in stool, diarrhea and vomiting.  Genitourinary:  Negative for difficulty urinating, flank pain, frequency and hematuria.  Musculoskeletal:  Negative for arthralgias and back pain.  Skin:  Negative for rash.  Neurological:  Negative for dizziness, speech difficulty, weakness, numbness and headaches.   Physical Exam Updated Vital Signs BP (!) 139/97   Pulse 79   Temp 98.8 F (37.1 C) (Oral)   Resp 13   Ht 5\' 4"  (1.626 m)   Wt 113.4 kg   LMP 07/19/2021 (Approximate)   SpO2 100%   BMI 42.91 kg/m   Physical Exam Constitutional:      Appearance: She is well-developed. She is obese.  HENT:     Head: Normocephalic and atraumatic.  Eyes:     Pupils:  Pupils are equal, round, and reactive to light.  Cardiovascular:     Rate and Rhythm: Normal rate and regular rhythm.     Heart sounds: Normal heart sounds.  Pulmonary:     Effort: Pulmonary effort is normal. No respiratory distress.     Breath sounds: Normal breath sounds. No wheezing or rales.  Chest:     Chest wall: No tenderness.  Abdominal:     General: Bowel sounds are normal.     Palpations: Abdomen is soft.     Tenderness: There is no abdominal tenderness. There is no guarding or rebound.  Musculoskeletal:        General: Normal range of motion.     Cervical back: Normal range of motion and neck supple.     Comments: No edema or calf tenderness  Lymphadenopathy:     Cervical: No cervical adenopathy.  Skin:    General: Skin is warm and dry.     Findings: No rash.  Neurological:     Mental Status: She is alert and oriented to person, place, and time.    ED Results / Procedures / Treatments   Labs (all labs ordered are listed, but only abnormal results are displayed) Labs Reviewed  BASIC METABOLIC PANEL - Abnormal; Notable for the following components:      Result Value   Sodium 134 (*)    Glucose, Bld 110 (*)    All other components within normal limits  CBC  PREGNANCY, URINE  TROPONIN I (HIGH SENSITIVITY)    EKG EKG Interpretation  Date/Time:  Friday August 18 2021 19:26:05 EST Ventricular Rate:  88 PR Interval:  120 QRS Duration: 84 QT Interval:  364 QTC Calculation: 440 R Axis:   79 Text Interpretation: Normal sinus rhythm Normal ECG since last tracing no significant change Confirmed by [id number]) on 08/18/2021 8:36:08 PM  Radiology No results found.  Procedures Procedures   Medications Ordered in ED Medications - No data to display  ED Course  I have reviewed the triage vital signs and the nursing notes.  Pertinent labs & imaging results that were available during my care of the patient were reviewed by me and considered in my  medical decision making (see chart for details).    MDM Rules/Calculators/A&P                           Patient presents with palpitations and feel like her heart is beating fast.  She currently has  a normal heart rate of 79.  Her blood pressure is a little bit high but nonconcerning.  She is calm and well-appearing.  She has no associated chest pain or shortness of breath.  No other symptoms that sound more concerning for either.  No suggestions of fluid overload.  EKG does not show any arrhythmias or ischemic changes.  Her labs are nonconcerning.  She was discharged home in good condition.  She has an appointment to follow-up with her PCP on Monday.  Return precautions were given. Final Clinical Impression(s) / ED Diagnoses Final diagnoses:  Palpitations    Rx / DC Orders ED Discharge Orders     None        Malvin Johns, MD 08/18/21 2131

## 2021-08-18 NOTE — ED Triage Notes (Signed)
Pt c/o elevated HR the past 3 days. C/o palpitations, jittery, cant sleep, nauseous. States chest pain intermittently.

## 2021-09-09 IMAGING — US US OB TRANSVAGINAL
1 series · 14 of 24 positions shown · non-contrast
Comparison: None.

CLINICAL DATA: Pelvic pain and vaginal bleeding. Six weeks 6 days
gestational age by LMP.

EXAM:
OBSTETRIC <14 WK US AND TRANSVAGINAL OB US
TECHNIQUE: Both transabdominal and transvaginal ultrasound examinations were
performed for complete evaluation of the gestation as well as the
maternal uterus, adnexal regions, and pelvic cul-de-sac.
Transvaginal technique was performed to assess early pregnancy.

[Series 1: us ob transvaginal · 14 of 24 slices shown]
[im 1/24]
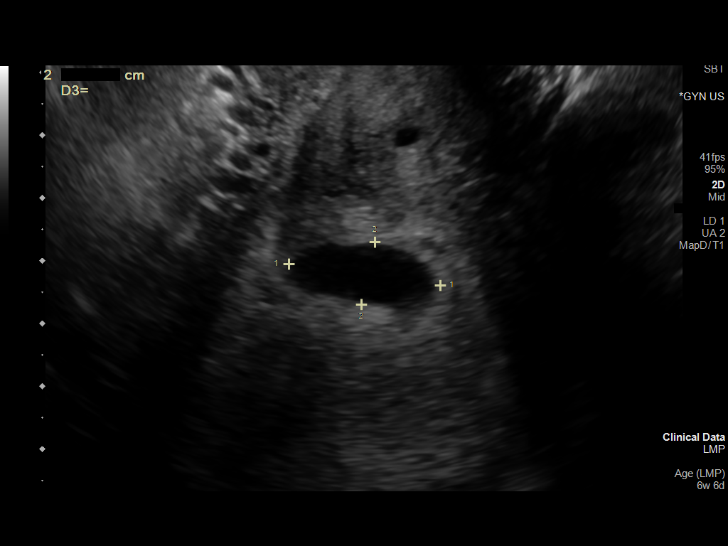
[im 3/24]
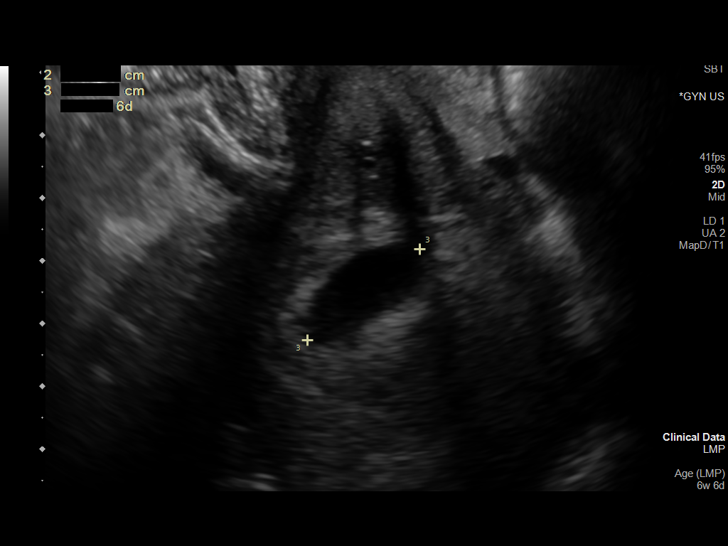
[im 5/24]
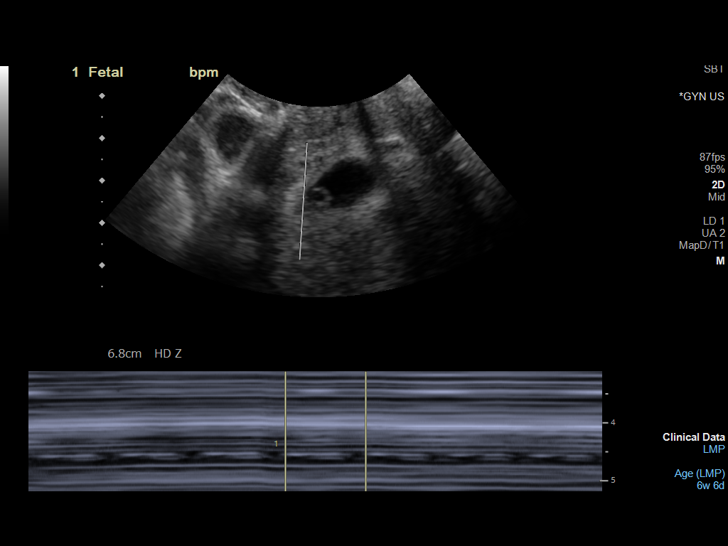
[im 7/24]
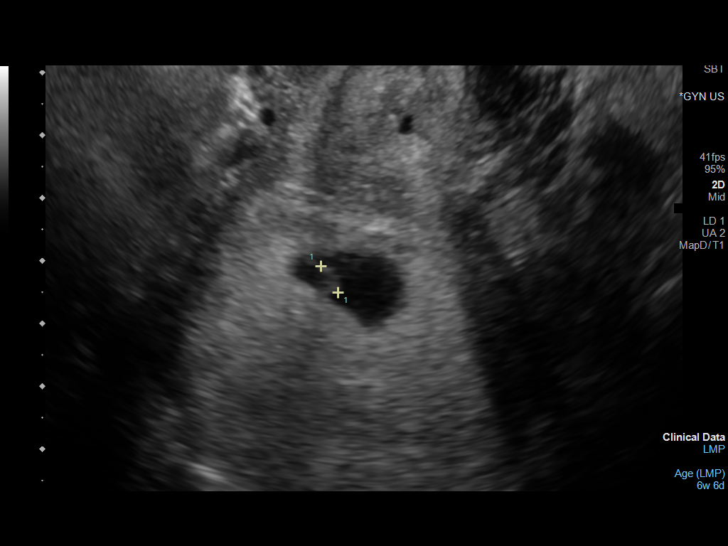
[im 8/24]
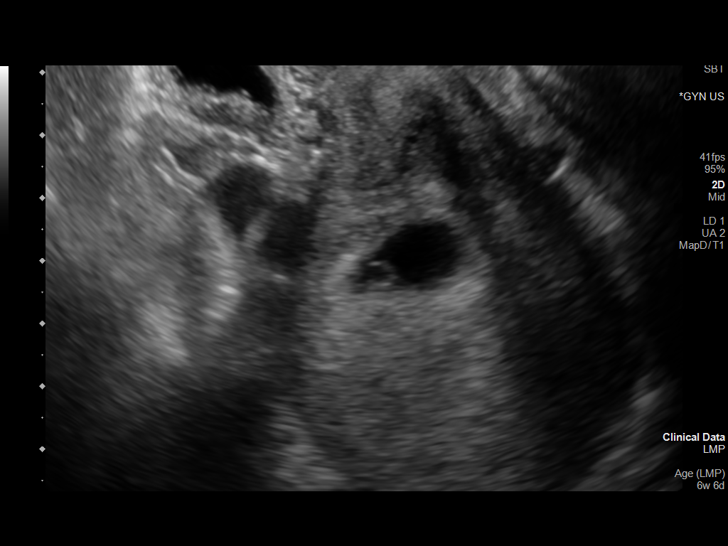
[im 10/24]
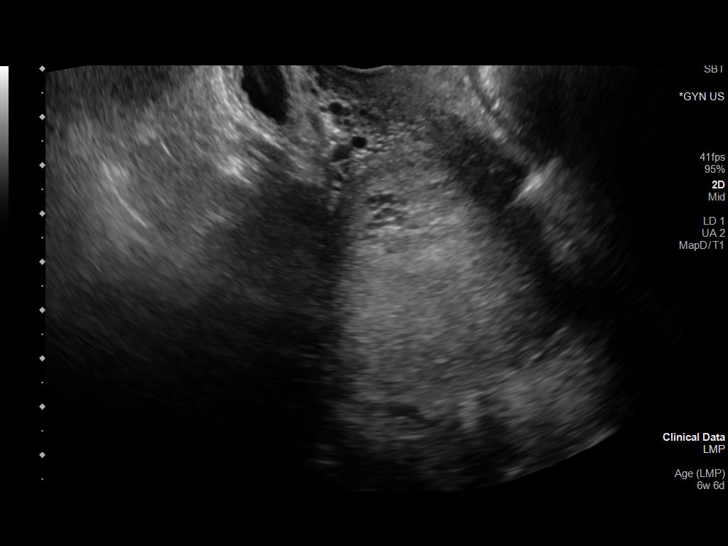
[im 12/24]
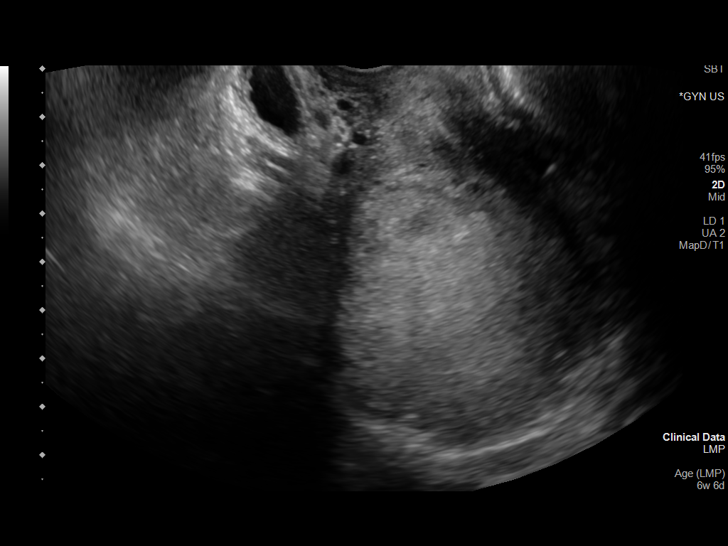
[im 13/24]
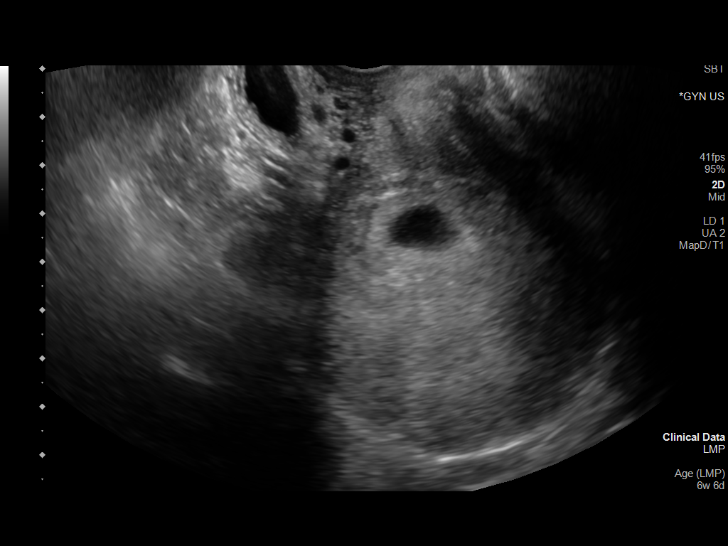
[im 15/24]
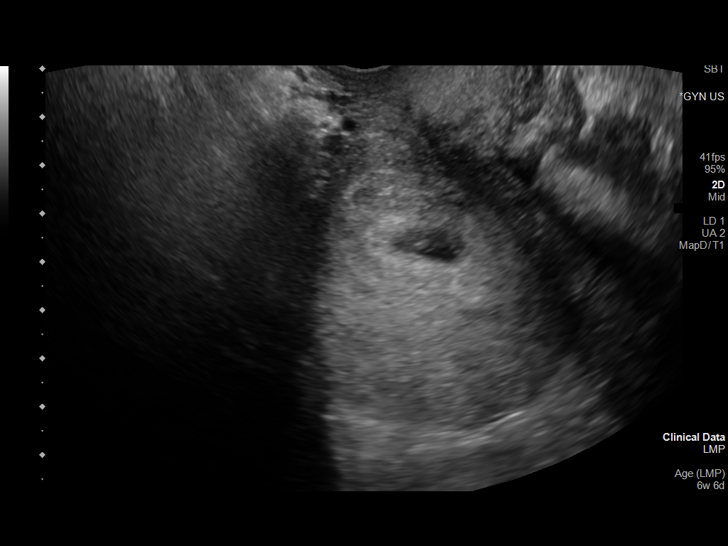
[im 17/24]
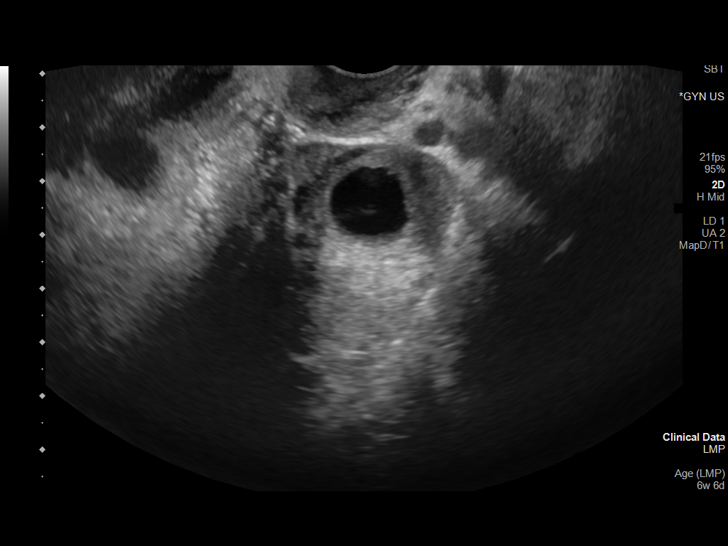
[im 19/24]
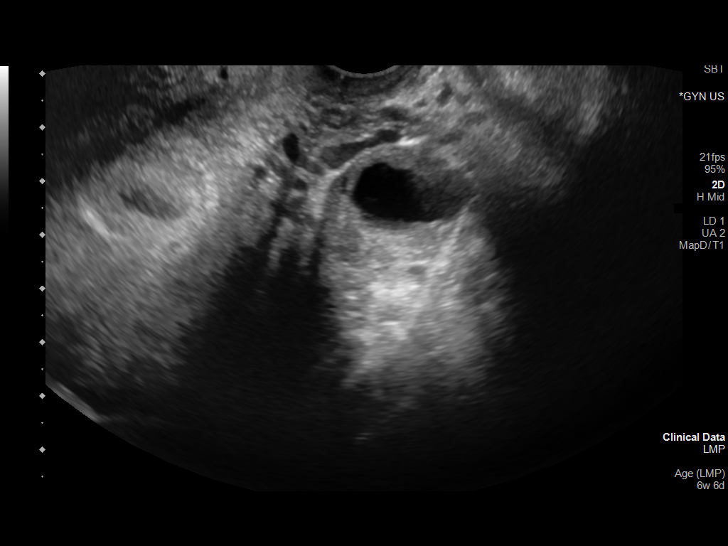
[im 20/24]
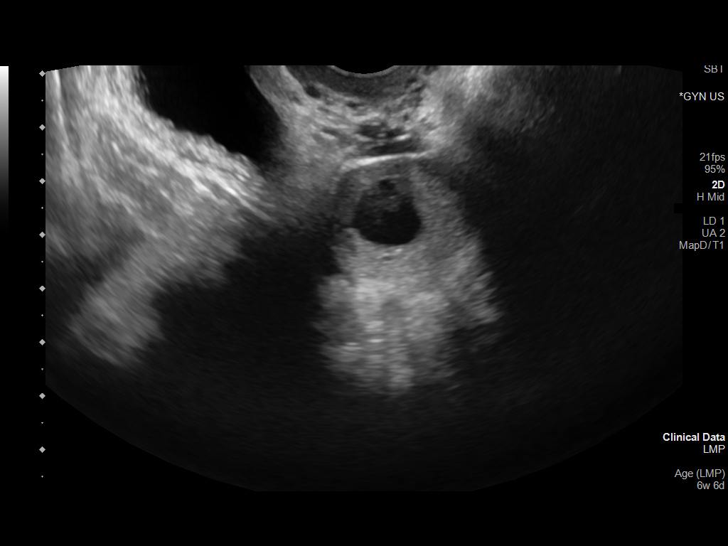
[im 22/24]
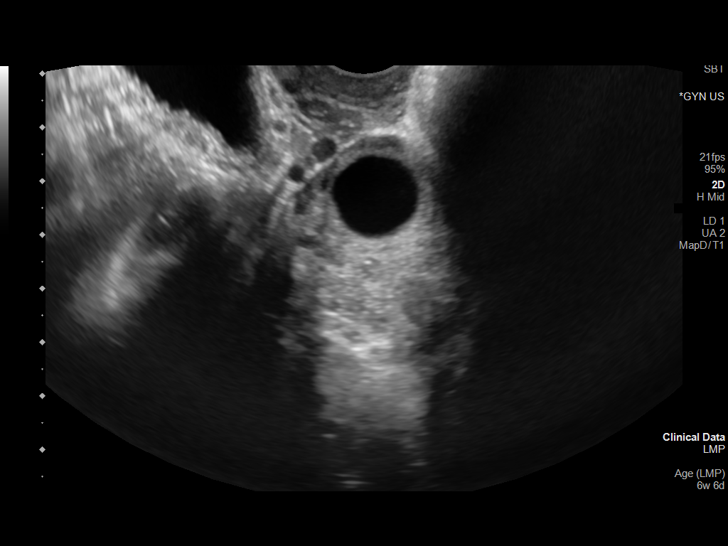
[im 24/24]
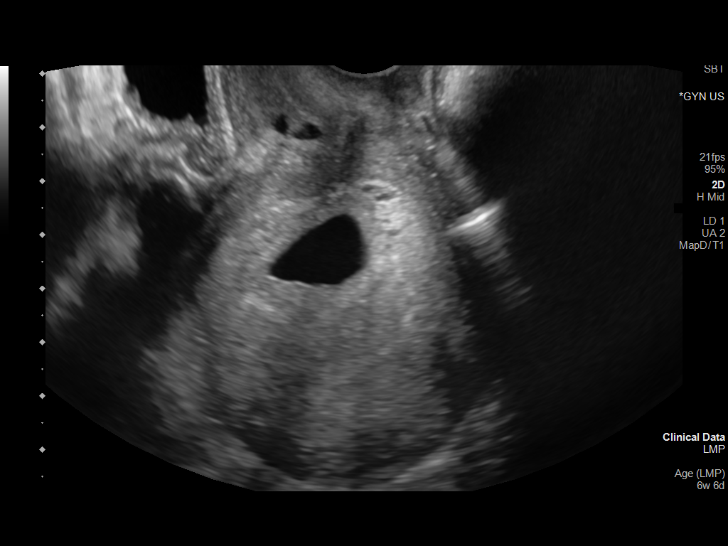

[14 of 24 positions shown; findings below may reference images not displayed]

FINDINGS: Intrauterine gestational sac: Single

Yolk sac:  Present

Embryo:  Present

Cardiac Activity: Present

Heart Rate: 127 bpm

CRL:  5 mm   6 w   1 d                  US EDC: 03/06/2021

Subchorionic hemorrhage:  None visualized.

Maternal uterus/adnexae: Myometrial parenchyma appears normal. Both
ovaries appear normal in size without evidence of mass. No free
fluid.
IMPRESSION: Normal appearing single living intrauterine pregnancy at 6 weeks 1
day by crown-rump length. No sign of subchorionic hemorrhage or
other complicating feature.

## 2024-08-25 ENCOUNTER — Ambulatory Visit: Admitting: Internal Medicine

## 2024-09-09 NOTE — Progress Notes (Unsigned)
 Cardiology Office Note Date:  09/10/2024  ID:  Jill Clark, DOB [birth year], MRN [id number] PCP:  Neysa Tinnie BRAVO, PA  Cardiologist:  Joelle VEAR Ren Donley, MD  Chief Complaint  Patient presents with   Palpitations      Problems CP/Palpitations EM 30 days 2020 unremarkable OSA on CPAP Obesity BMI 47 HTN M: XL200  Visits  12/25: D/C XL, TTE, EM 72 hrs    History of Present Illness: Discussed the use of AI scribe software for clinical note transcription with the patient, who gave verbal consent to proceed.  Jill Clark is a 33 year old female with hypertension who presents with palpitations. She has had fast heartbeats for a couple of months, occurring about five days per week and lasting for hours, with rates over 100 but not above 150 both day and night. Palpitations worsen when she skips her blood pressure medication or does not use her CPAP. She has no shortness of breath, dizziness, or lightheadedness during episodes, but does note shortness of breath when climbing stairs. She takes metoprolol 200 mg once daily for hypertension and does not check blood pressure at home. She does not smoke and is not aware of heart disease in her family.   ROS: Please see the history of present illness. All other systems are reviewed and negative.   Past Medical History:  Diagnosis Date   Hypertension    Obesity     No past surgical history on file.  Current Outpatient Medications  Medication Sig Dispense Refill   ibuprofen  (ADVIL ,MOTRIN ) 800 MG tablet Take 1 tablet (800 mg total) every 8 (eight) hours as needed by mouth. 21 tablet 0   metoprolol (TOPROL-XL) 200 MG 24 hr tablet Take 200 mg by mouth.     traMADol  (ULTRAM ) 50 MG tablet Take 1 tablet (50 mg total) every 6 (six) hours as needed by mouth for severe pain. 15 tablet 0   cyclobenzaprine  (FLEXERIL ) 10 MG tablet Take 1 tablet (10 mg total) at bedtime by mouth. (Patient not taking: Reported on 09/10/2024) 10 tablet 0    hydrochlorothiazide  (HYDRODIURIL ) 25 MG tablet Take 25 mg by mouth every morning. Has not been taking for past 3 weeks (Patient not taking: Reported on 09/10/2024)     HYDROcodone -acetaminophen  (NORCO/VICODIN) 5-325 MG per tablet Take one by mouth at bedtime as needed for pain (Patient not taking: Reported on 09/10/2024) 10 tablet 0   metaxalone  (SKELAXIN ) 800 MG tablet Take 1 tablet (800 mg total) by mouth 3 (three) times daily. (Patient not taking: Reported on 09/10/2024) 30 tablet 0   metoprolol tartrate (LOPRESSOR) 50 MG tablet Take 50 mg 1 day or 1 dose by mouth. (Patient not taking: Reported on 09/10/2024)     metroNIDAZOLE (FLAGYL) 500 MG tablet Take 500 mg by mouth 2 (two) times daily. (Patient not taking: Reported on 09/10/2024)     No current facility-administered medications for this visit.    Allergies:   Patient has no known allergies.   Social History:  see above  Family History:  see above  PHYSICAL EXAM: VS:  BP 134/84 (BP Location: Right Arm, Patient Position: Sitting, Cuff Size: Large)   Pulse 80   Ht 5' 4 (1.626 m)   Wt 278 lb (126.1 kg)   SpO2 99%   BMI 47.72 kg/m  , BMI Body mass index is 47.72 kg/m. GEN: Well nourished, well developed, in no acute distress HEENT: normal Neck: no JVD, carotid bruits, or masses Cardiac: RRR;  no murmurs, rubs, or gallops,no edema  Respiratory:  CTAB bilaterally, normal work of breathing GI: soft, nontender, nondistended, + BS Extremities: No LE edema Skin: warm and dry, no rash Neuro:  Strength and sensation are intact  EKG: NSR  Recent Labs: Reviewed  Studies: Reviewed  ASSESSMENT AND PLAN: Jill Clark is a 33 y.o. female who presents for new visit.     Palpitations Intermittent palpitations with tachycardia, exacerbated at night and without CPAP. Possible SVT given worsening when she misses her metop dose.  - D/C metoprolol to assess for SVT - Ordered 3-day heart monitor to assess heart rate and rhythm. - Ordered  echocardiogram to evaluate cardiac function.  Hypertension Managed with metoprolol, which complicates heart rate assessment. Home blood pressure monitoring not performed.  - Plan to discontinue metoprolol for accurate assessment. - Provided blood pressure log for home monitoring. - Advised to contact provider if blood pressure readings are concerning. - Plan to initiate alternative antihypertensive medication after metoprolol taper.     Signed, Joelle VEAR Ren Donley, MD  09/10/2024 11:17 AM    [facility]

## 2024-09-10 ENCOUNTER — Ambulatory Visit

## 2024-09-10 VITALS — BP 134/84 | HR 80 | Ht 64.0 in | Wt 278.0 lb

## 2024-09-10 DIAGNOSIS — Z6841 Body Mass Index (BMI) 40.0 and over, adult: Secondary | ICD-10-CM | POA: Diagnosis not present

## 2024-09-10 DIAGNOSIS — R002 Palpitations: Secondary | ICD-10-CM

## 2024-09-10 DIAGNOSIS — R0789 Other chest pain: Secondary | ICD-10-CM

## 2024-09-10 DIAGNOSIS — E66813 Obesity, class 3: Secondary | ICD-10-CM | POA: Diagnosis not present

## 2024-09-10 DIAGNOSIS — G4733 Obstructive sleep apnea (adult) (pediatric): Secondary | ICD-10-CM

## 2024-09-10 NOTE — Progress Notes (Unsigned)
 Enrolled for [employer] to mail a ZIO XT long term holter monitor to the patients address on file.

## 2024-09-10 NOTE — Patient Instructions (Signed)
 Medication Instructions:  STOP METOPROLOL  *If you need a refill on your cardiac medications before your next appointment, please call your pharmacy*   Testing/Procedures: Your physician has requested that you have an echocardiogram. Echocardiography is a painless test that uses sound waves to create images of your heart. It provides your doctor with information about the size and shape of your heart and how well your hearts chambers and valves are working. This procedure takes approximately one hour. There are no restrictions for this procedure. Please do NOT wear cologne, perfume, aftershave, or lotions (deodorant is allowed). Please arrive 15 minutes prior to your appointment time.  Please note: We ask at that you not bring children with you during ultrasound (echo/ vascular) testing. Due to room size and safety concerns, children are not allowed in the ultrasound rooms during exams. Our front office staff cannot provide observation of children in our lobby area while testing is being conducted. An adult accompanying a patient to their appointment will only be allowed in the ultrasound room at the discretion of the ultrasound technician under special circumstances. We apologize for any inconvenience. [address]  Follow-Up: At [facility], you and your health needs are our priority.  As part of our continuing mission to provide you with exceptional heart care, our providers are all part of one team.  This team includes your primary Cardiologist (physician) and Advanced Practice Providers or APPs (Physician Assistants and Nurse Practitioners) who all work together to provide you with the care you need, when you need it.  Your next appointment:   AS NEEDED  Other Instructions Your physician has requested that you wear a Zio heart monitor for 3 days. This will be mailed to your home with instructions on how to apply the monitor and how to return it when finished. Please allow 2  weeks after returning the heart monitor before our office calls you with the results.

## 2024-09-15 ENCOUNTER — Encounter: Payer: Self-pay | Admitting: Emergency Medicine

## 2024-09-15 ENCOUNTER — Ambulatory Visit
Admission: EM | Admit: 2024-09-15 | Discharge: 2024-09-15 | Disposition: A | Discharge status: Home / Self Care | Attending: Family Medicine | Admitting: Family Medicine

## 2024-09-15 DIAGNOSIS — R21 Rash and other nonspecific skin eruption: Secondary | ICD-10-CM

## 2024-09-15 MED ORDER — CLOTRIMAZOLE-BETAMETHASONE 1-0.05 % EX CREA: TOPICAL_CREAM | CUTANEOUS | 0 refills | Status: AC

## 2024-09-15 NOTE — ED Provider Notes (Signed)
 " [facility]    CSN: [id number] Arrival date & time: 09/15/24  1802      History   Chief Complaint Chief Complaint  Patient presents with   Rash    HPI Jill Clark is a 33 y.o. female.   Patient has a rash around her neck.  Slowly getting better.  Started off on the left side now its on the right side as well.  It is flaky and itchy.  Today is more symptomatic.    Past Medical History:  Diagnosis Date   Hypertension    Obesity     There are no active problems to display for this patient.   History reviewed. No pertinent surgical history.  OB History     Gravida  1   Para      Term      Preterm      AB      Living         SAB      IAB      Ectopic      Multiple      Live Births               Home Medications    Prior to Admission medications  Medication Sig Start Date End Date Taking? Authorizing Provider  clotrimazole -betamethasone  (LOTRISONE ) cream Apply to affected area 2 times daily prn 09/15/24  Yes Maranda Jamee Jacob, MD  metoprolol (TOPROL-XL) 200 MG 24 hr tablet Take 200 mg by mouth daily.   Yes [provider]    Family History History reviewed. No pertinent family history.  Social History Social History[1]   Allergies   Patient has no known allergies.   Review of Systems Review of Systems  See HPI Physical Exam Triage Vital Signs ED Triage Vitals  Encounter Vitals Group     BP 09/15/24 1907 (!) 163/115     Girls Systolic BP Percentile --      Girls Diastolic BP Percentile --      Boys Systolic BP Percentile --      Boys Diastolic BP Percentile --      Pulse Rate 09/15/24 1907 (!) 110     Resp 09/15/24 1907 18     Temp 09/15/24 1907 98.5 F (36.9 C)     Temp Source 09/15/24 1907 Oral     SpO2 09/15/24 1907 97 %     Weight 09/15/24 1905 275 lb (124.7 kg)     Height 09/15/24 1905 5' 4 (1.626 m)     Head Circumference --      Peak Flow --      Pain Score 09/15/24 1905 0     Pain  Loc --      Pain Education --      Exclude from Growth Chart --    No data found.  Updated Vital Signs BP (!) 156/115 (BP Location: Right Arm)   Pulse (!) 110   Temp 98.5 F (36.9 C) (Oral)   Resp 18   Ht 5' 4 (1.626 m)   Wt 124.7 kg   LMP 09/06/2024   SpO2 97%   Breastfeeding No   BMI 47.20 kg/m      Physical Exam Constitutional:      General: She is not in acute distress.    Appearance: She is well-developed. She is obese.  HENT:     Head: Normocephalic and atraumatic.  Eyes:     Conjunctiva/sclera: Conjunctivae normal.  Pupils: Pupils are equal, round, and reactive to light.  Cardiovascular:     Rate and Rhythm: Normal rate.  Pulmonary:     Effort: Pulmonary effort is normal. No respiratory distress.  Abdominal:     General: There is no distension.     Palpations: Abdomen is soft.  Musculoskeletal:        General: Normal range of motion.     Cervical back: Normal range of motion.  Skin:    General: Skin is warm and dry.     Findings: Rash present.     Comments: On each side of the neck at the level of the collarbone there is a raised scaling patch hyperpigmented left measures 2 x 3 cm the right measures 1 x 2 cm  Neurological:     Mental Status: She is alert.      UC Treatments / Results  Labs (all labs ordered are listed, but only abnormal results are displayed) Labs Reviewed - No data to display  EKG   Radiology No results found.  Procedures Procedures (including critical care time)  Medications Ordered in UC Medications - No data to display  Initial Impression / Assessment and Plan / UC Course  I have reviewed the triage vital signs and the nursing notes.  Pertinent labs & imaging results that were available during my care of the patient were reviewed by me and considered in my medical decision making (see chart for details).     Discussed elevated blood pressure Discussed that rash looks mostly eczematous.  Because of her concern  for ringworm we will give her a combination cream.  See PCP if fails to improve Final Clinical Impressions(s) / UC Diagnoses   Final diagnoses:  Rash and nonspecific skin eruption     Discharge Instructions      Apply lotion to the rash 2 times a day Do not use longer than 2 weeks.  If it has not cleared up by then see your primary care doctor or return   ED Prescriptions     Medication Sig Dispense Auth. Provider   clotrimazole -betamethasone  (LOTRISONE ) cream Apply to affected area 2 times daily prn 15 g Maranda Jamee Jacob, MD      PDMP not reviewed this encounter.    [1]  Social History Tobacco Use   Smoking status: Never   Smokeless tobacco: Never  Vaping Use   Vaping status: Never Used  Substance Use Topics   Alcohol use: Yes    Comment: social   Drug use: No     Maranda Jamee Jacob, MD 09/15/24 1927  "

## 2024-09-15 NOTE — ED Triage Notes (Signed)
 Patient c/o rash on her neck x 1 month, it started really bothering her today.  The rash is itching and burning.  Denies any OTC creams.

## 2024-09-15 NOTE — Discharge Instructions (Signed)
 Apply lotion to the rash 2 times a day Do not use longer than 2 weeks.  If it has not cleared up by then see your primary care doctor or return

## 2024-09-28 DIAGNOSIS — R002 Palpitations: Secondary | ICD-10-CM

## 2024-10-01 ENCOUNTER — Ambulatory Visit: Payer: Self-pay

## 2024-10-21 ENCOUNTER — Ambulatory Visit (HOSPITAL_COMMUNITY)
Admission: RE | Admit: 2024-10-21 | Discharge: 2024-10-21 | Disposition: A | Discharge status: Home / Self Care | Source: Ambulatory Visit | Attending: Cardiovascular Disease | Admitting: Cardiovascular Disease

## 2024-10-21 DIAGNOSIS — R002 Palpitations: Secondary | ICD-10-CM | POA: Diagnosis present

## 2024-10-21 DIAGNOSIS — I10 Essential (primary) hypertension: Secondary | ICD-10-CM

## 2024-10-21 LAB — ECHOCARDIOGRAM COMPLETE: S' Lateral: 2.9 cm
